# Patient Record
Sex: Female | Born: 1970 | Race: Black or African American | Hispanic: No | Marital: Married | Smoking: Never smoker
Health system: Southern US, Community
[De-identification: ages and names within clinical notes are randomized; demographics above are authoritative.]

---

## 2000-09-09 ENCOUNTER — Other Ambulatory Visit: Admission: RE | Admit: 2000-09-09 | Discharge: 2000-09-09 | Payer: Self-pay | Admitting: Obstetrics and Gynecology

## 2001-04-18 ENCOUNTER — Inpatient Hospital Stay (HOSPITAL_COMMUNITY): Admission: AD | Admit: 2001-04-18 | Discharge: 2001-04-21 | Payer: Self-pay | Admitting: Obstetrics & Gynecology

## 2006-12-19 ENCOUNTER — Other Ambulatory Visit: Admission: RE | Admit: 2006-12-19 | Discharge: 2006-12-19 | Payer: Self-pay | Admitting: Obstetrics and Gynecology

## 2008-08-27 ENCOUNTER — Emergency Department (HOSPITAL_COMMUNITY): Admission: EM | Admit: 2008-08-27 | Discharge: 2008-08-27 | Payer: Self-pay | Admitting: Emergency Medicine

## 2008-08-27 IMAGING — CR DG RIBS W/ CHEST 3+V*L*
3 series · 3 of 3 positions shown · non-contrast
Comparison: No priors

CLINICAL DATA: Left chest pain with inspiration

LEFT RIBS AND CHEST - 3+ VIEW

[view not recorded (1 of 3)]
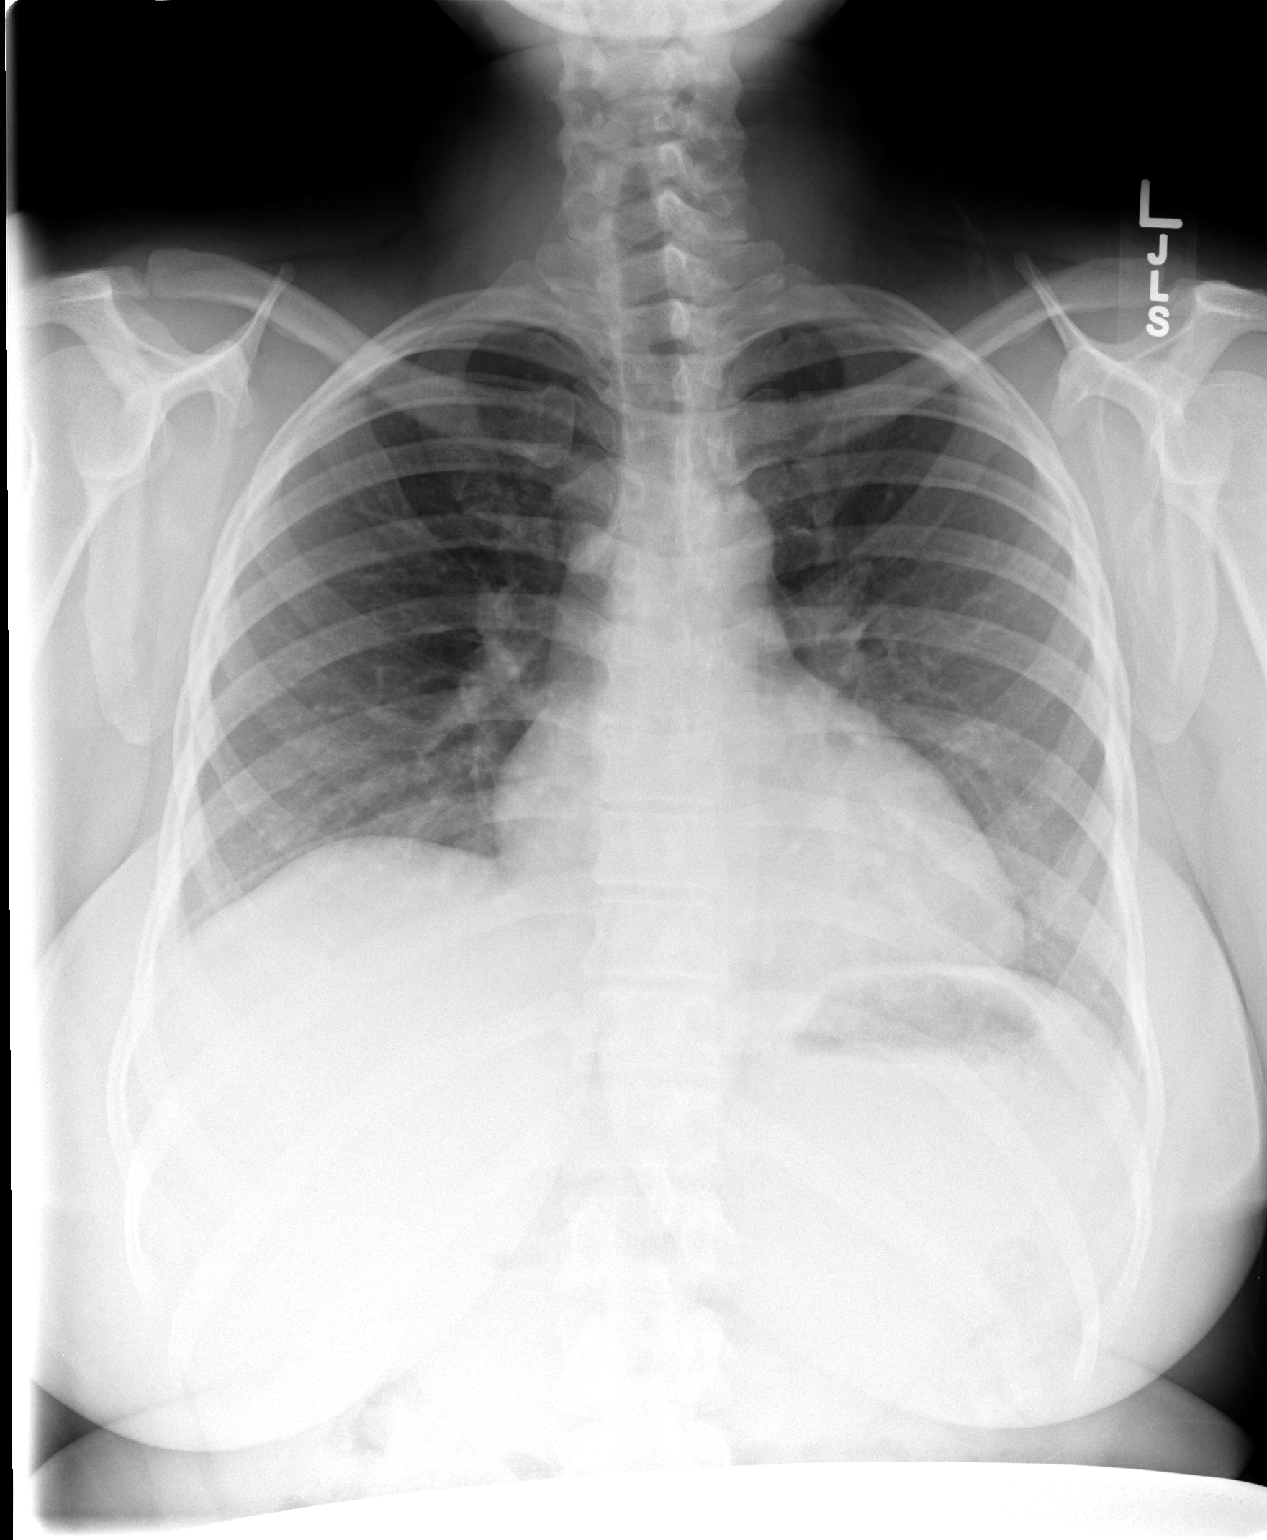

[view not recorded (2 of 3)]
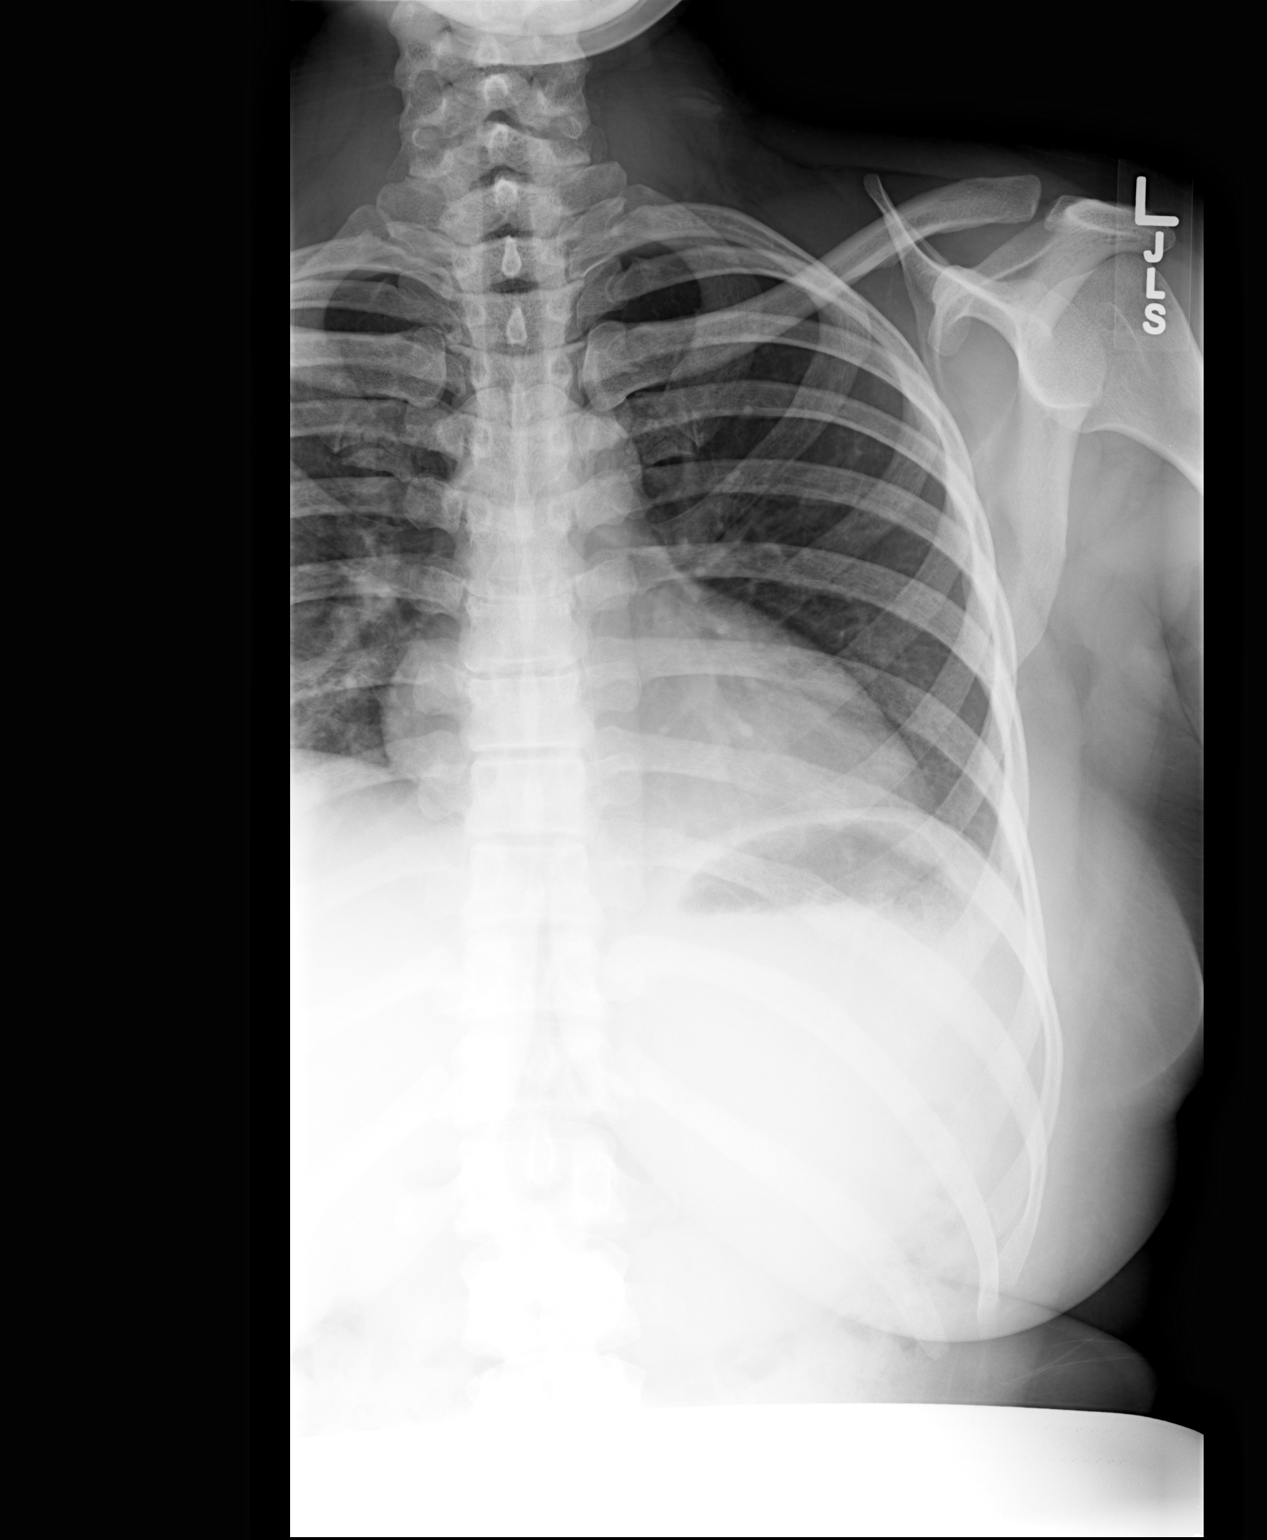

[view not recorded (3 of 3)]
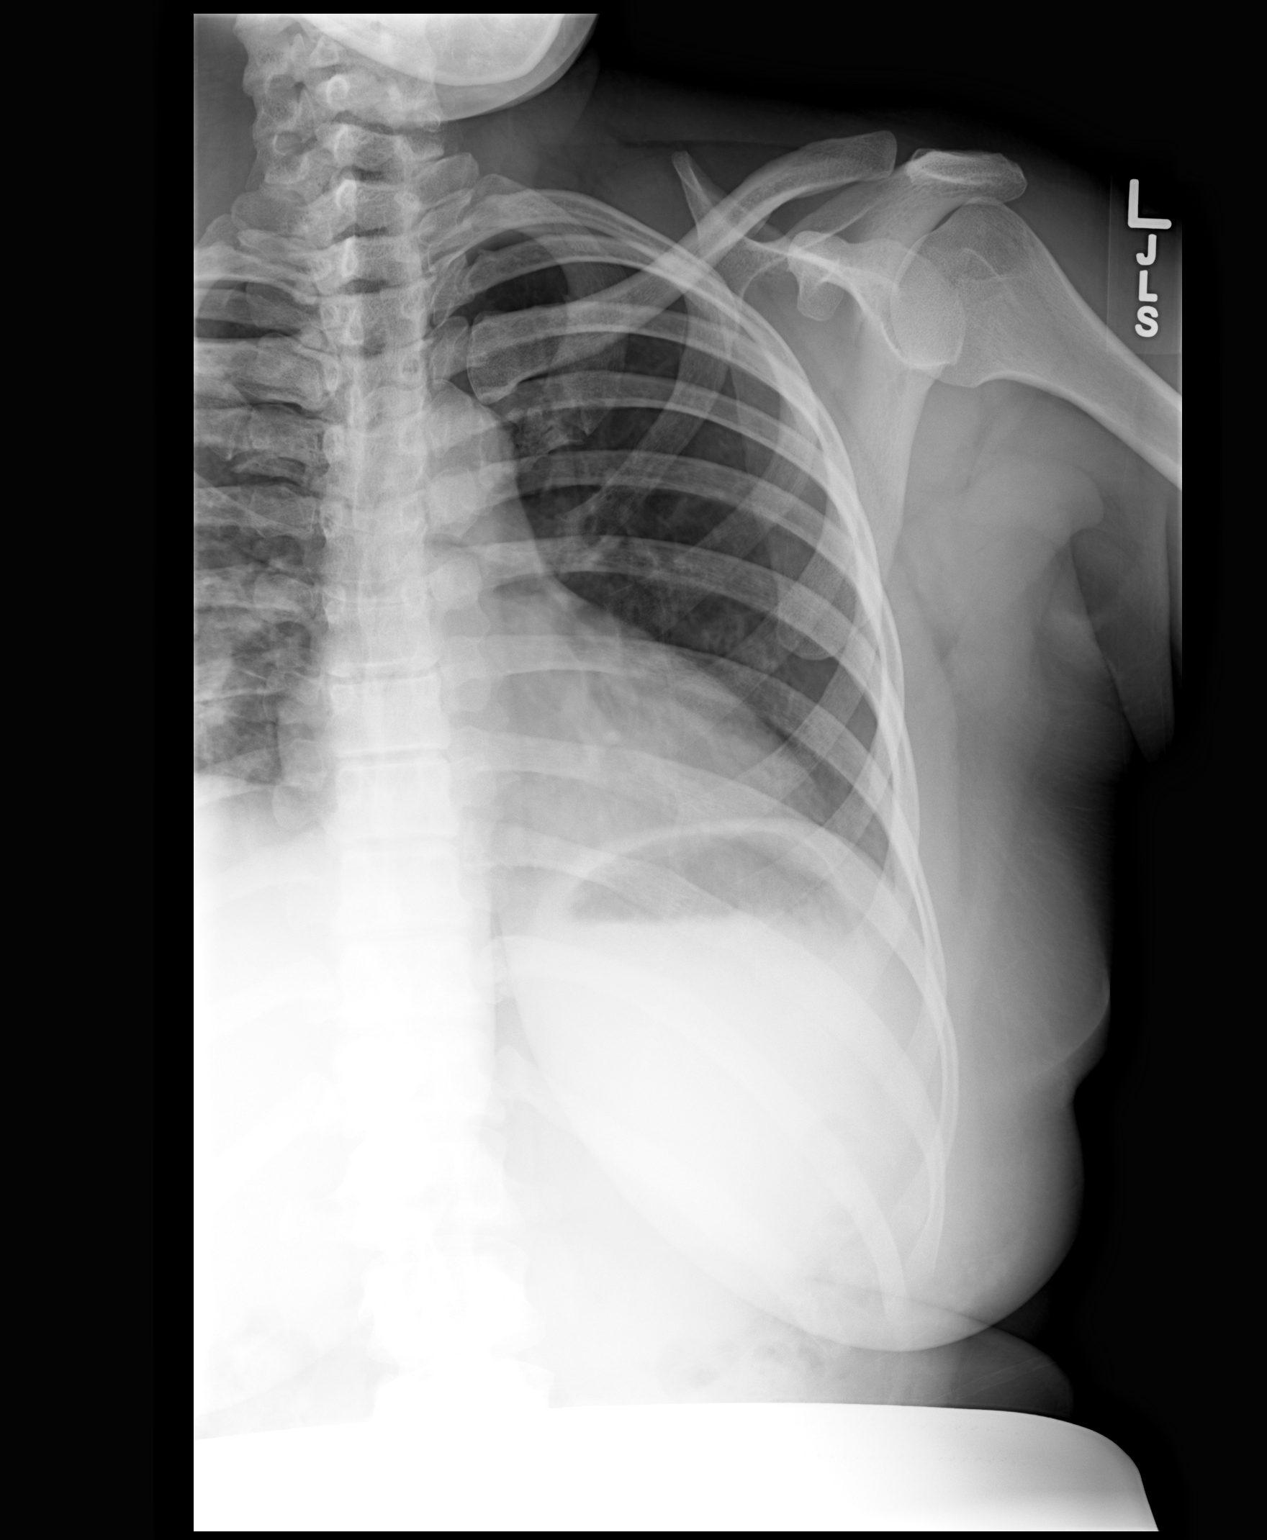

[3 of 3 positions shown; findings below may reference images not displayed]

FINDINGS: The abdomen and pelvis were shielded.  No rib fractures,
pneumothorax, or left pleural fluid.  Lungs clear.  Heart
mediastinal contours normal.
IMPRESSION: No acute or significant findings.

## 2010-08-21 ENCOUNTER — Emergency Department (HOSPITAL_COMMUNITY)
Admission: EM | Admit: 2010-08-21 | Discharge: 2010-08-21 | Payer: Self-pay | Source: Home / Self Care | Admitting: Family Medicine

## 2012-05-10 ENCOUNTER — Other Ambulatory Visit: Payer: Self-pay | Admitting: Obstetrics and Gynecology

## 2012-05-10 ENCOUNTER — Other Ambulatory Visit (HOSPITAL_COMMUNITY)
Admission: RE | Admit: 2012-05-10 | Discharge: 2012-05-10 | Disposition: A | Discharge status: Home / Self Care | Payer: 59 | Source: Ambulatory Visit | Attending: Obstetrics and Gynecology | Admitting: Obstetrics and Gynecology

## 2012-05-10 DIAGNOSIS — Z113 Encounter for screening for infections with a predominantly sexual mode of transmission: Secondary | ICD-10-CM | POA: Insufficient documentation

## 2012-05-10 DIAGNOSIS — Z1159 Encounter for screening for other viral diseases: Secondary | ICD-10-CM | POA: Insufficient documentation

## 2012-05-10 DIAGNOSIS — Z01419 Encounter for gynecological examination (general) (routine) without abnormal findings: Secondary | ICD-10-CM | POA: Insufficient documentation

## 2014-04-03 ENCOUNTER — Emergency Department (HOSPITAL_COMMUNITY)
Admission: EM | Admit: 2014-04-03 | Discharge: 2014-04-03 | Disposition: A | Discharge status: Home / Self Care | Payer: Self-pay | Source: Home / Self Care

## 2015-05-16 ENCOUNTER — Emergency Department (HOSPITAL_BASED_OUTPATIENT_CLINIC_OR_DEPARTMENT_OTHER)
Admission: EM | Admit: 2015-05-16 | Discharge: 2015-05-16 | Disposition: A | Discharge status: Home / Self Care | Payer: 59 | Attending: Emergency Medicine | Admitting: Emergency Medicine

## 2015-05-16 ENCOUNTER — Encounter (HOSPITAL_BASED_OUTPATIENT_CLINIC_OR_DEPARTMENT_OTHER): Payer: Self-pay

## 2015-05-16 ENCOUNTER — Emergency Department (HOSPITAL_BASED_OUTPATIENT_CLINIC_OR_DEPARTMENT_OTHER): Payer: 59

## 2015-05-16 DIAGNOSIS — M79645 Pain in left finger(s): Secondary | ICD-10-CM | POA: Diagnosis not present

## 2015-05-16 DIAGNOSIS — M25442 Effusion, left hand: Secondary | ICD-10-CM | POA: Insufficient documentation

## 2015-05-16 DIAGNOSIS — M79642 Pain in left hand: Secondary | ICD-10-CM | POA: Diagnosis present

## 2015-05-16 IMAGING — DX DG FINGER THUMB 2+V*L*
3 series · 3 of 3 positions shown · non-contrast
Comparison: None.

CLINICAL DATA: Left thumb pain, with pain and swelling at the
interphalangeal joint. Initial encounter.

EXAM:
LEFT THUMB 2+V

[finger ap]
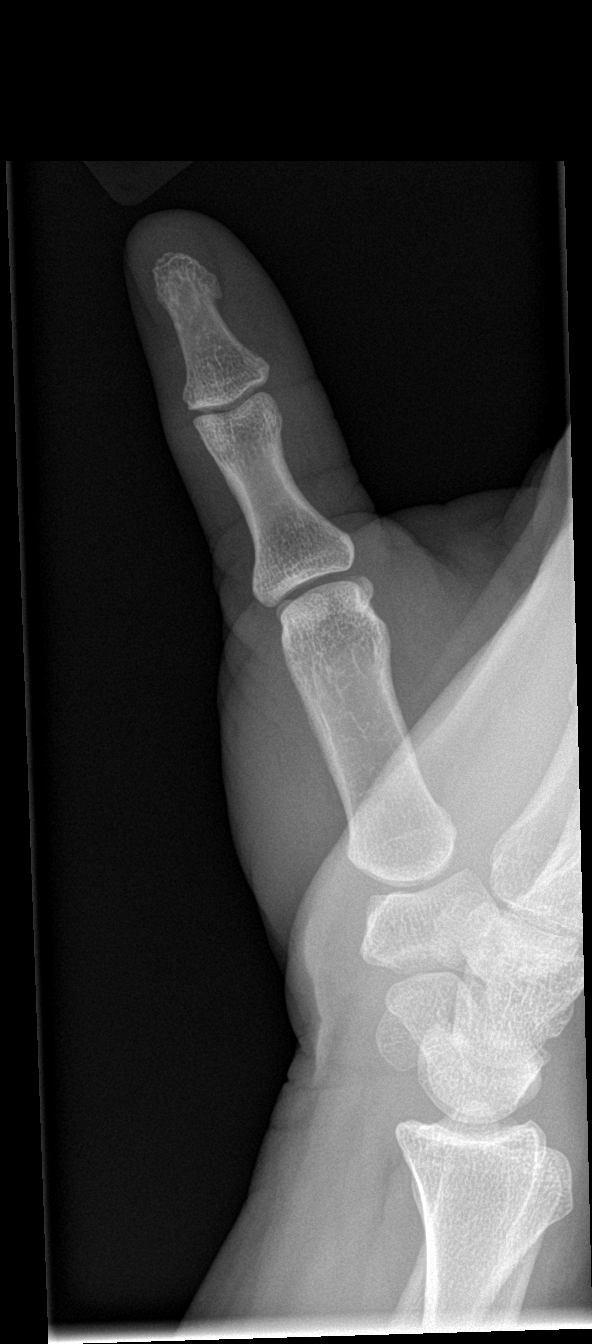

[finger obl]
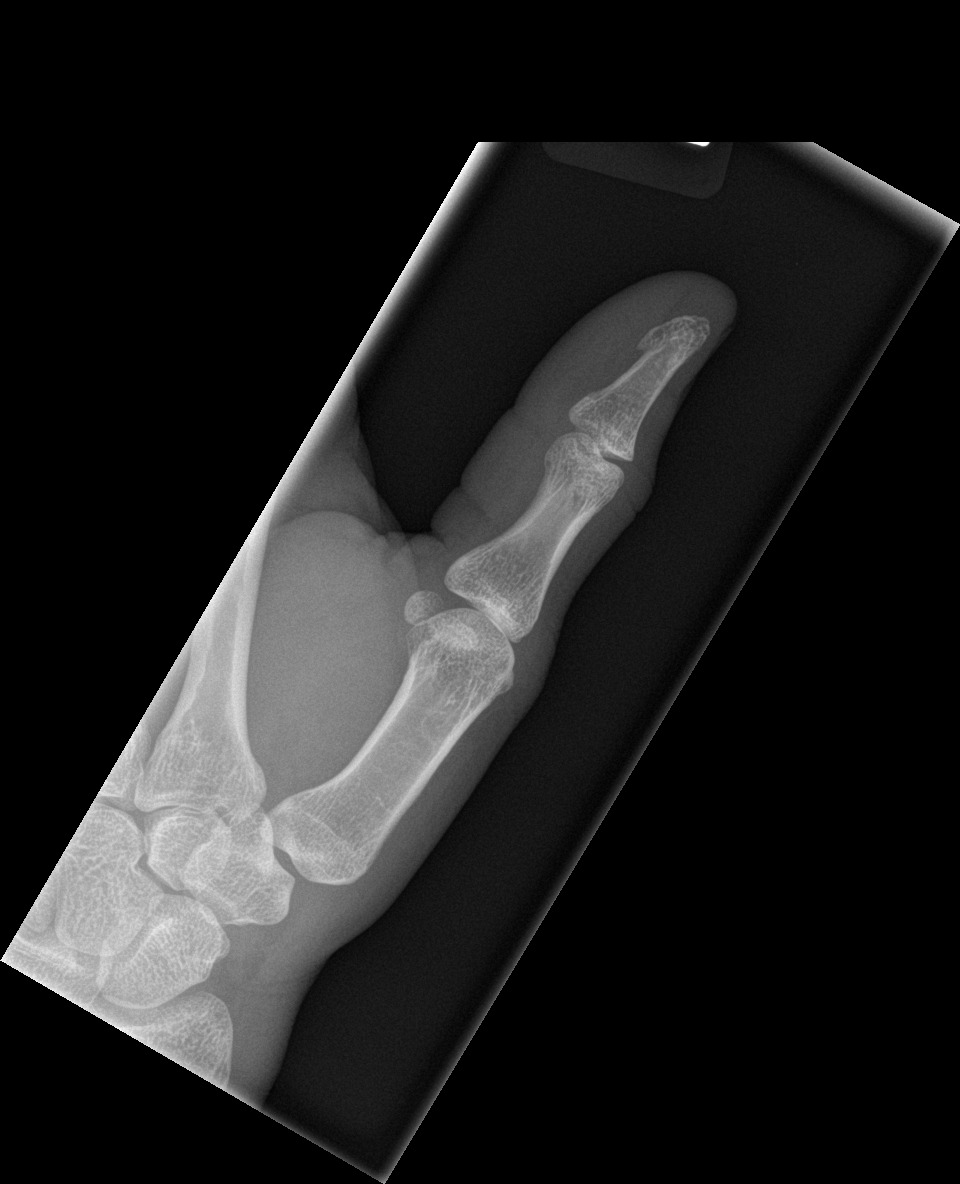

[finger lat]
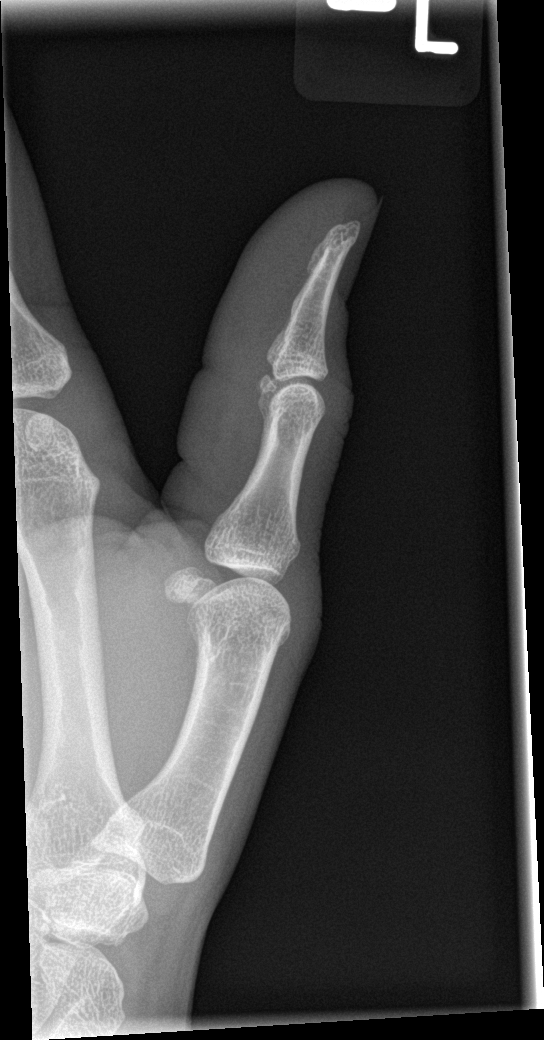

[3 of 3 positions shown; findings below may reference images not displayed]

FINDINGS: There is no evidence of fracture or dislocation. The left thumb
appears intact. Visualized joint spaces are preserved. No definite
soft tissue abnormalities are characterized on radiograph.
IMPRESSION: No evidence of fracture or dislocation.

## 2015-05-16 MED ORDER — HYDROCODONE-ACETAMINOPHEN 5-325 MG PO TABS
ORAL_TABLET | ORAL | Status: AC
  Filled 2015-05-16: qty 1

## 2015-05-16 MED ORDER — IBUPROFEN 800 MG PO TABS
800.0000 mg | ORAL_TABLET | Freq: Once | ORAL | Status: AC
  Administered 2015-05-16: 800 mg via ORAL
  Filled 2015-05-16: qty 1

## 2015-05-16 MED ORDER — IBUPROFEN 800 MG PO TABS: 800.0000 mg | ORAL_TABLET | Freq: Three times a day (TID) | ORAL | Status: AC

## 2015-05-16 MED ORDER — HYDROCODONE-ACETAMINOPHEN 5-325 MG PO TABS
2.0000 | ORAL_TABLET | Freq: Once | ORAL | Status: DC
Start: 2015-05-16 — End: 2015-05-16
  Administered 2015-05-16: 1 via ORAL

## 2015-05-16 NOTE — ED Notes (Signed)
Pt c/o lt thumb pain, no injury

## 2015-05-16 NOTE — ED Provider Notes (Signed)
CSN: [id number]     Arrival date & time 05/16/15  0426 History   First MD Initiated Contact with Patient 05/16/15 [phone]     Chief Complaint  Patient presents with  . Hand Pain     (Consider location/radiation/quality/duration/timing/severity/associated sxs/prior Treatment) The history is provided by the patient.  Stacy Morgan is a 44 y.o. female otherwise healthy here presenting with left thumb pain. Patient states that the joints in her hands are always swollen. For the last several days, she noticed that her thumb has been more painful and swollen. Denies pain around the nails. Denies fall or injury. Woke her up from sleep. No hx of lupus, rheumatoid arthritis, or gout.    History reviewed. No pertinent past medical history. History reviewed. No pertinent past surgical history. No family history on file. History  Substance Use Topics  . Smoking status: Never Smoker   . Smokeless tobacco: Not on file  . Alcohol Use: No   OB History    No data available     Review of Systems  Musculoskeletal:       L thumb pain   All other systems reviewed and are negative.     Allergies  Review of patient's allergies indicates no known allergies.  Home Medications   Prior to Admission medications   Not on File   BP 126/81 mmHg  Pulse 76  Temp(Src) 99.8 F (37.7 C) (Oral)  Resp 20  Ht 5\' 2"  (1.575 m)  Wt 176 lb (79.833 kg)  BMI 32.18 kg/m2  SpO2 98%  LMP 04/24/2015 Physical Exam  Constitutional: She is oriented to person, place, and time. She appears well-nourished.  Uncomfortable   HENT:  Head: Normocephalic.  Eyes: Pupils are equal, round, and reactive to light.  Neck: Normal range of motion.  Cardiovascular: Normal rate.   Pulmonary/Chest: Effort normal.  Abdominal: Soft.  Musculoskeletal:  L thumb interphalangeal joint tender and mildly swollen. Dec ROM from pain. No evidence of paronychia or felon. Nl capillary refill. PIP joints of the hands are swollen but no  painful.   Neurological: She is alert and oriented to person, place, and time.  Skin: Skin is warm and dry.  Psychiatric: She has a normal mood and affect. Her behavior is normal. Thought content normal.  Nursing note and vitals reviewed.   ED Course  Procedures (including critical care time) Labs Review Labs Reviewed - No data to display  Imaging Review Dg Finger Thumb Left  05/16/2015   CLINICAL DATA:  Left thumb pain, with pain and swelling at the interphalangeal joint. Initial encounter.  EXAM: LEFT THUMB 2+V  COMPARISON:  None.  FINDINGS: There is no evidence of fracture or dislocation. The left thumb appears intact. Visualized joint spaces are preserved. No definite soft tissue abnormalities are characterized on radiograph.  IMPRESSION: No evidence of fracture or dislocation.   Electronically Signed   By: Garald Balding M.D.   On: 05/16/2015 04:55     EKG Interpretation None      MDM   Final diagnoses:  None   Stacy Morgan is a 44 y.o. female here with L thumb pain. Consider rheumatoid arthritis vs gout. I doubt septic joint. Will get xrays and give pain meds.   5:03 AM Xray showed no evidence of arthritis or fracture. Will dc home with motrin, ice pack. Likely inflamed joint. Will give hand surgery f/u.    Wandra Arthurs, MD 05/16/15 [phone]

## 2015-05-16 NOTE — Discharge Instructions (Signed)
Take motrin for pain.   Apply ice on it.   Follow up with hand surgery.   Return to ER if you have severe pain, worse joint swelling.

## 2015-06-12 ENCOUNTER — Other Ambulatory Visit: Payer: Self-pay | Admitting: Family Medicine

## 2015-06-12 ENCOUNTER — Ambulatory Visit
Admission: RE | Admit: 2015-06-12 | Discharge: 2015-06-12 | Disposition: A | Discharge status: Home / Self Care | Payer: 59 | Source: Ambulatory Visit | Attending: Family Medicine | Admitting: Family Medicine

## 2015-06-12 DIAGNOSIS — M542 Cervicalgia: Secondary | ICD-10-CM

## 2015-06-12 IMAGING — CR DG CERVICAL SPINE 2 OR 3 VIEWS
4 series · 4 of 4 positions shown · non-contrast
Comparison: None.

CLINICAL DATA: Patient started having posterior neck pain 1 week
ago. Pain has worsened from the last 2 days. Also having dizziness
and nausea. No known injury.

EXAM:
CERVICAL SPINE - 2-3 VIEW

[w cervical spine lat]
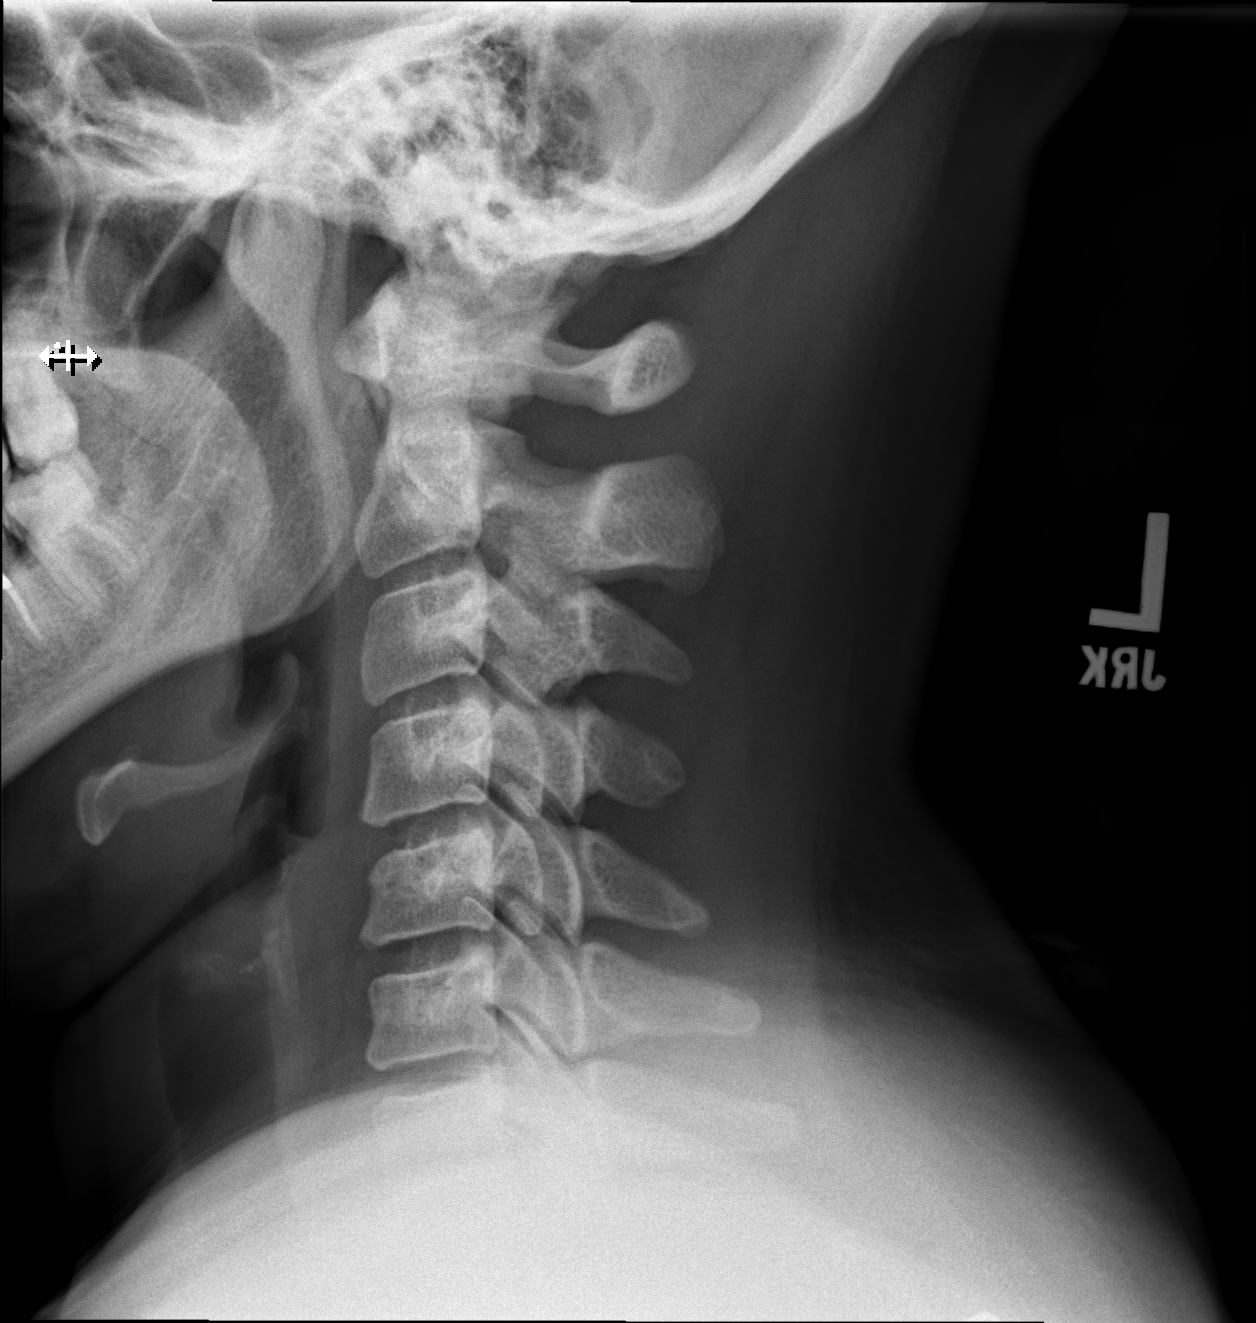

[w cervical swimmers]
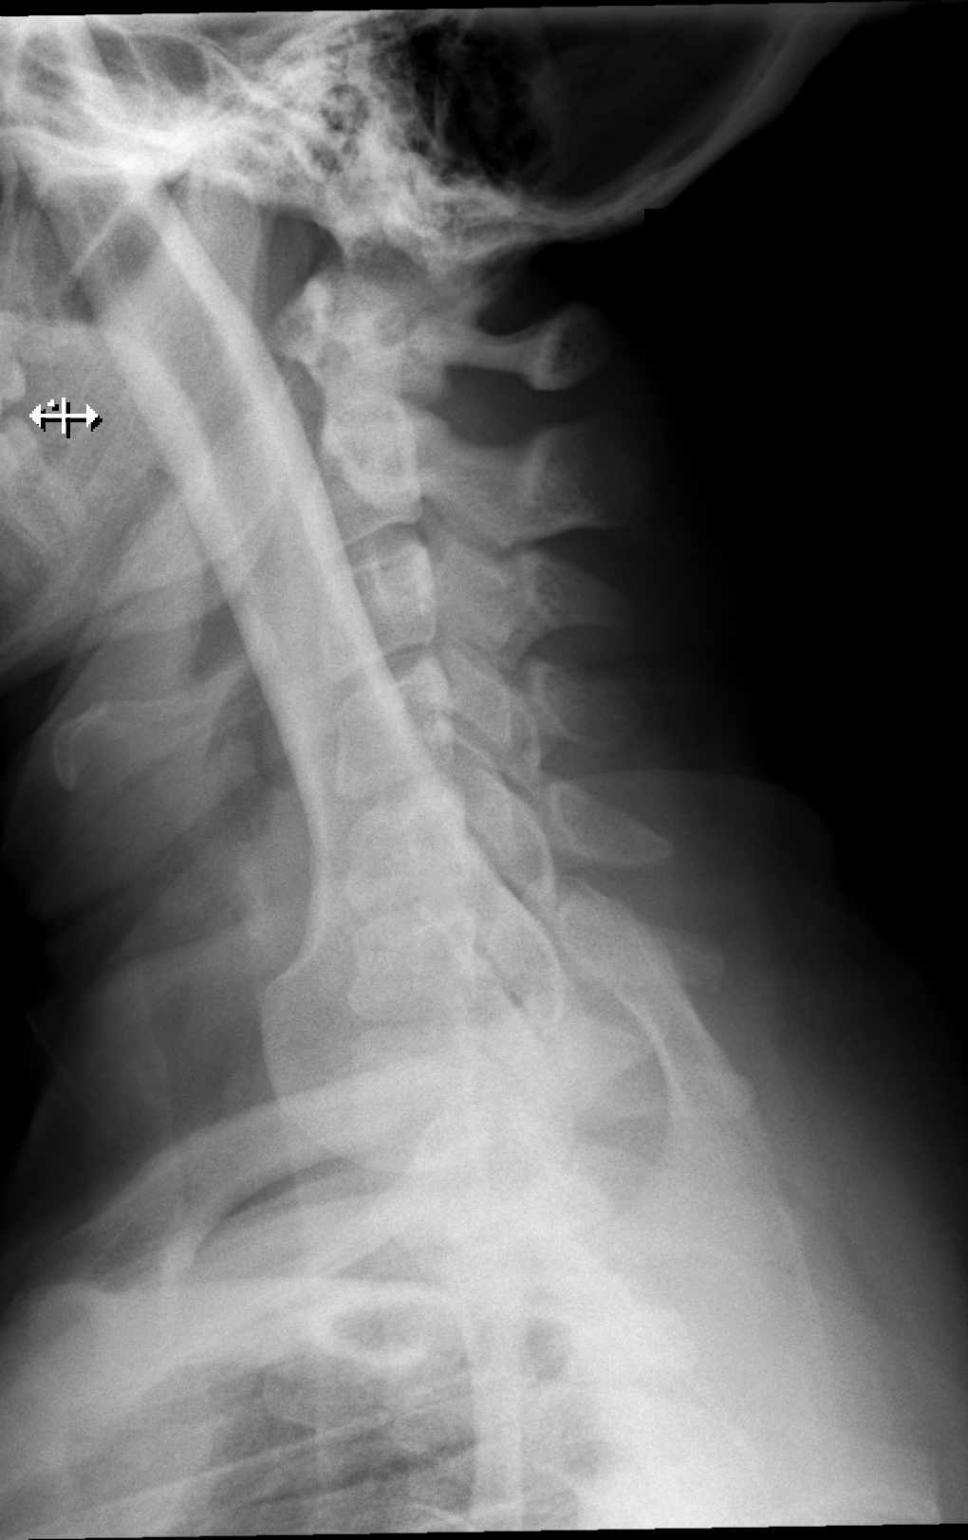

[w cervical spine odontoid (1 of 2)]
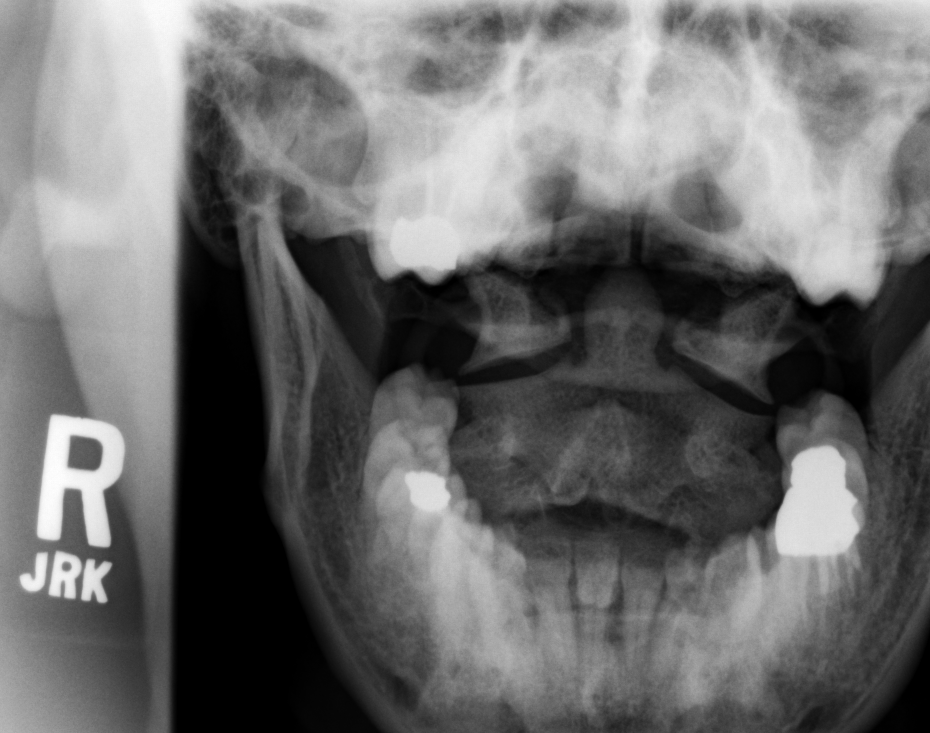

[w cervical spine odontoid (2 of 2)]
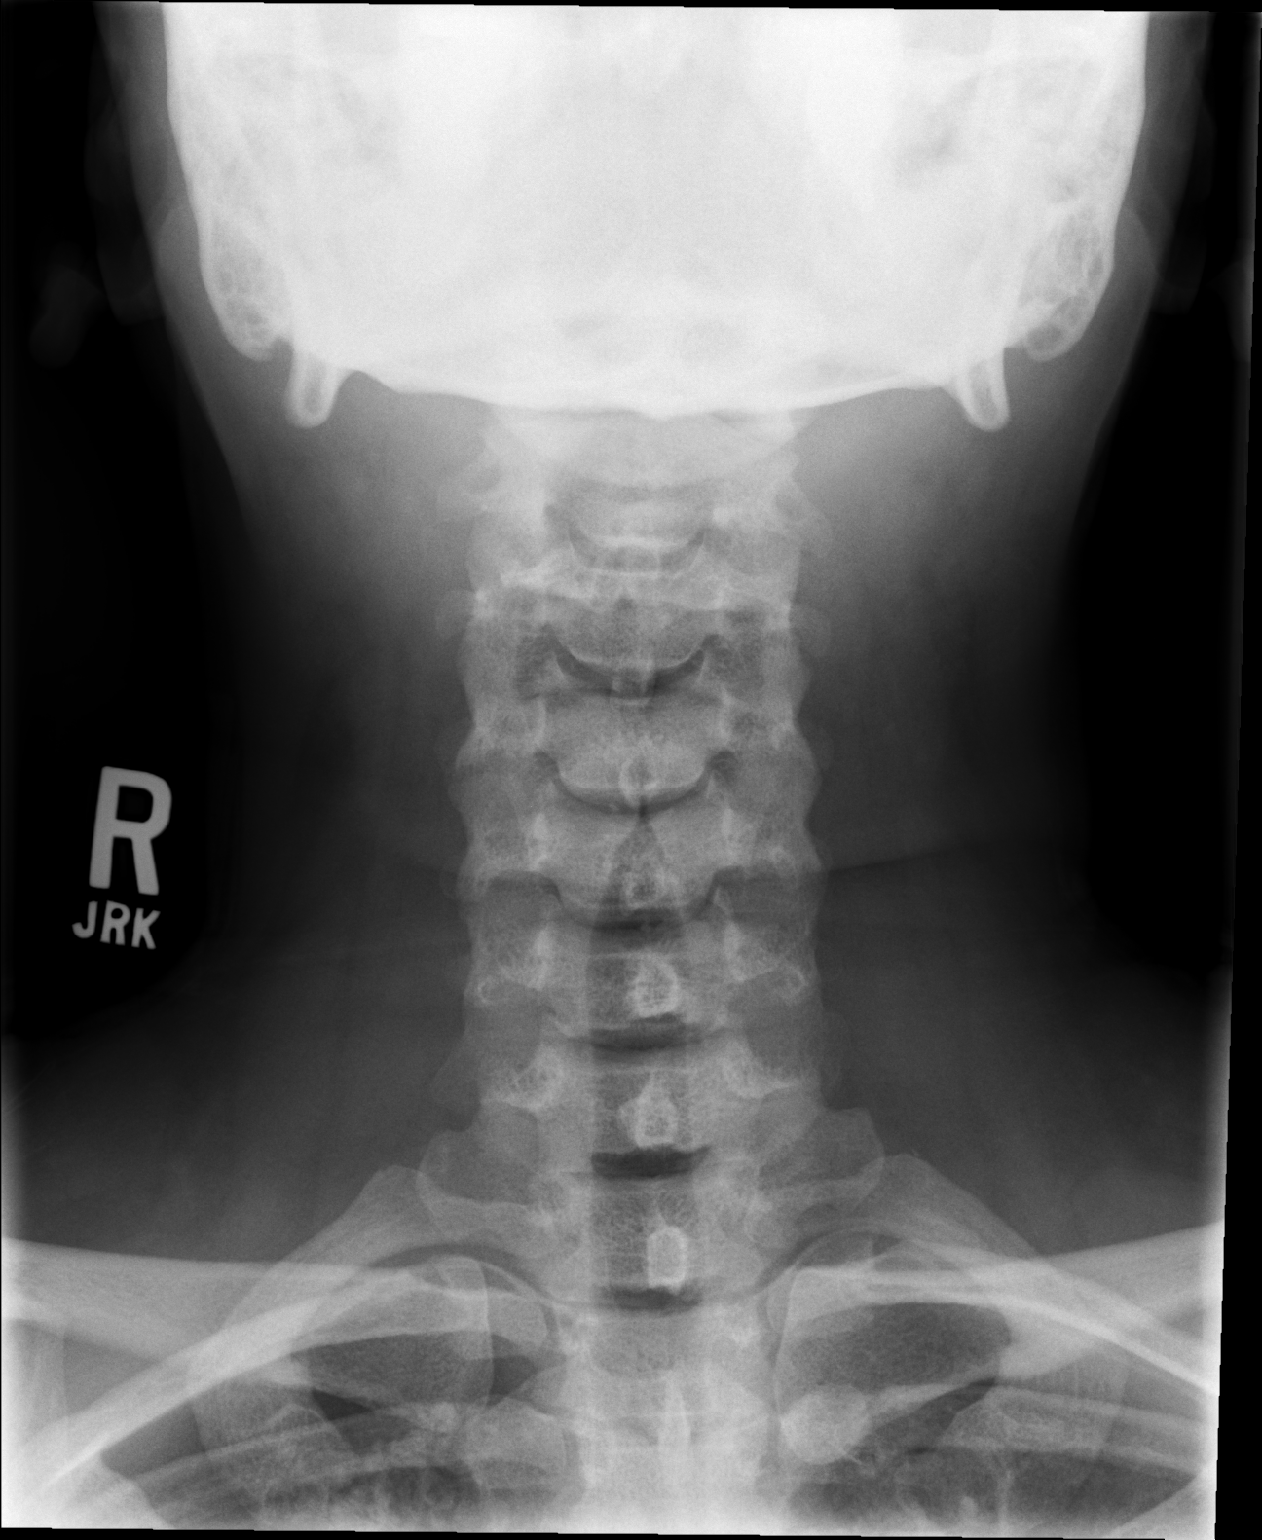

[4 of 4 positions shown; findings below may reference images not displayed]

FINDINGS: No fracture. No spondylolisthesis. Disc spaces are well maintained.
No degenerative change. Normal soft tissues.
IMPRESSION: Normal exam.

## 2016-08-10 DIAGNOSIS — Z3141 Encounter for fertility testing: Secondary | ICD-10-CM | POA: Diagnosis not present

## 2016-08-17 DIAGNOSIS — Z36 Encounter for antenatal screening of mother: Secondary | ICD-10-CM | POA: Diagnosis not present

## 2016-08-17 DIAGNOSIS — Z1159 Encounter for screening for other viral diseases: Secondary | ICD-10-CM | POA: Diagnosis not present

## 2016-08-17 DIAGNOSIS — Z118 Encounter for screening for other infectious and parasitic diseases: Secondary | ICD-10-CM | POA: Diagnosis not present

## 2016-08-17 DIAGNOSIS — E288 Other ovarian dysfunction: Secondary | ICD-10-CM | POA: Diagnosis not present

## 2016-08-17 DIAGNOSIS — Z113 Encounter for screening for infections with a predominantly sexual mode of transmission: Secondary | ICD-10-CM | POA: Diagnosis not present

## 2016-09-28 DIAGNOSIS — Z3202 Encounter for pregnancy test, result negative: Secondary | ICD-10-CM | POA: Diagnosis not present

## 2016-09-28 DIAGNOSIS — D259 Leiomyoma of uterus, unspecified: Secondary | ICD-10-CM | POA: Diagnosis not present

## 2016-09-28 DIAGNOSIS — E288 Other ovarian dysfunction: Secondary | ICD-10-CM | POA: Diagnosis not present

## 2016-09-28 MED FILL — ZOVIA 1-35E TABLET: 1-35 | 28 days supply | Qty: 28 | Fill #0

## 2016-10-27 DIAGNOSIS — N8300 Follicular cyst of ovary, unspecified side: Secondary | ICD-10-CM | POA: Diagnosis not present

## 2016-10-28 MED FILL — LETROZOLE 2.5 MG TABLET: 2.5 | 5 days supply | Qty: 10 | Fill #0

## 2016-10-28 MED FILL — CLOMIPHENE CITRATE 50 MG TA: 50 | 12 days supply | Qty: 24 | Fill #0

## 2016-10-29 MED FILL — GANIRELIX ACET 250 MCG/0.5: 250 | 28 days supply | Qty: 1 | Fill #0

## 2016-10-29 MED FILL — MENOPUR 75 UNIT VIAL: 75 | 28 days supply | Qty: 10 | Fill #0

## 2016-10-29 MED FILL — GONAL-F RFF REDI-JECT 900 U: 900 | 28 days supply | Qty: 3 | Fill #0

## 2016-10-29 MED FILL — BD SAFETYGLIDE NDL 25GX1: 25G X 1" | 15 days supply | Qty: 15 | Fill #0

## 2016-10-29 MED FILL — BD 3 ML SYRINGE 18GX1-1/2: 18G X 1-1/2 | 15 days supply | Qty: 15 | Fill #0

## 2016-10-29 MED FILL — LEUPROLIDE 2WK 1 MG/0.2 ML: 1 | 28 days supply | Qty: 1 | Fill #0

## 2016-10-29 MED FILL — BD SAFETYGLIDE NDL 25GX1": 25G X 1" | 15 days supply | Qty: 15 | Fill #0

## 2016-10-29 MED FILL — GONAL-F RFF REDI-JECT 300 U: 300 | 1 days supply | Qty: 1 | Fill #0

## 2016-10-29 MED FILL — BD 3 ML SYRINGE 18GX1-1/2": 18G X 1-1/2 | 15 days supply | Qty: 15 | Fill #0

## 2016-11-02 DIAGNOSIS — N8300 Follicular cyst of ovary, unspecified side: Secondary | ICD-10-CM | POA: Diagnosis not present

## 2016-11-04 DIAGNOSIS — N8300 Follicular cyst of ovary, unspecified side: Secondary | ICD-10-CM | POA: Diagnosis not present

## 2016-11-07 DIAGNOSIS — N8300 Follicular cyst of ovary, unspecified side: Secondary | ICD-10-CM | POA: Diagnosis not present

## 2016-11-28 ENCOUNTER — Encounter (HOSPITAL_BASED_OUTPATIENT_CLINIC_OR_DEPARTMENT_OTHER): Payer: Self-pay | Admitting: Emergency Medicine

## 2016-11-28 ENCOUNTER — Emergency Department (HOSPITAL_BASED_OUTPATIENT_CLINIC_OR_DEPARTMENT_OTHER)
Admission: EM | Admit: 2016-11-28 | Discharge: 2016-11-29 | Disposition: A | Discharge status: Home / Self Care | Payer: 59 | Attending: Emergency Medicine | Admitting: Emergency Medicine

## 2016-11-28 DIAGNOSIS — Z791 Long term (current) use of non-steroidal anti-inflammatories (NSAID): Secondary | ICD-10-CM | POA: Diagnosis not present

## 2016-11-28 DIAGNOSIS — M5431 Sciatica, right side: Secondary | ICD-10-CM | POA: Diagnosis not present

## 2016-11-28 DIAGNOSIS — M5441 Lumbago with sciatica, right side: Secondary | ICD-10-CM | POA: Insufficient documentation

## 2016-11-28 DIAGNOSIS — M545 Low back pain: Secondary | ICD-10-CM | POA: Diagnosis not present

## 2016-11-28 LAB — URINALYSIS, MICROSCOPIC (REFLEX)

## 2016-11-28 LAB — URINALYSIS, ROUTINE W REFLEX MICROSCOPIC
Bilirubin Urine: NEGATIVE
Glucose, UA: NEGATIVE mg/dL
Ketones, ur: NEGATIVE mg/dL
Leukocytes, UA: NEGATIVE
Nitrite: NEGATIVE
Protein, ur: NEGATIVE mg/dL
Specific Gravity, Urine: 1.023 (ref 1.005–1.030)
pH: 5 (ref 5.0–8.0)

## 2016-11-28 LAB — PREGNANCY, URINE: Preg Test, Ur: NEGATIVE

## 2016-11-28 NOTE — ED Triage Notes (Signed)
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 Pt also reports pelvic pain. Pt is going through IVF and was supposed to have eggs retrieved this week but the procedure was cancelled.

## 2016-11-29 DIAGNOSIS — M5441 Lumbago with sciatica, right side: Secondary | ICD-10-CM | POA: Diagnosis not present

## 2016-11-29 DIAGNOSIS — Z791 Long term (current) use of non-steroidal anti-inflammatories (NSAID): Secondary | ICD-10-CM | POA: Diagnosis not present

## 2016-11-29 DIAGNOSIS — M545 Low back pain: Secondary | ICD-10-CM | POA: Diagnosis not present

## 2016-11-29 MED ORDER — OXYCODONE-ACETAMINOPHEN 5-325 MG PO TABS: 1.0000 | ORAL_TABLET | Freq: Four times a day (QID) | ORAL | 0 refills | Status: AC | PRN

## 2016-11-29 MED ORDER — ONDANSETRON 8 MG PO TBDP
8.0000 mg | ORAL_TABLET | Freq: Once | ORAL | Status: AC
  Administered 2016-11-29: 8 mg via ORAL
  Filled 2016-11-29: qty 1

## 2016-11-29 MED ORDER — HYDROMORPHONE HCL 1 MG/ML IJ SOLN
2.0000 mg | Freq: Once | INTRAMUSCULAR | Status: AC
  Administered 2016-11-29: 2 mg via INTRAMUSCULAR
  Filled 2016-11-29: qty 2

## 2016-11-29 MED FILL — OXYCODONE W/APAP 5/325 TAB: 5-325 | 5 days supply | Qty: 20 | Fill #0

## 2016-11-29 NOTE — ED Provider Notes (Signed)
[facility] Provider Note: Georgena Spurling, MD, FACEP  CSN: [id number] MRN: [id number] ARRIVAL: 11/28/16 at 2041 ROOM: [facility]10/[facility]10  By signing my name below, I, Neta Mends, attest that this documentation has been prepared under the direction and in the presence of Shanon Rosser, MD . Electronically Signed: Neta Mends, ED Scribe. 11/29/2016. 12:12 AM.  CHIEF COMPLAINT  Back Pain   HISTORY OF PRESENT ILLNESS  Stacy Morgan is a 46 y.o. female reports severe, sharp, constant right lower back pain that began 11/27/16 in the AM. Pt states that the pain radiates down her right leg. Pain is worse with movement and she has difficulty getting comfortable when sitting down. Pt denies numbness, tingling. She is not aware of any trauma that may have triggered this. She is currently on her menses.  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  Consultation with the [facility] state controlled substances database reveals the patient has received no opioid prescriptions in the past year.   History reviewed. No pertinent past medical history.  History reviewed. No pertinent surgical history.  No family history on file.  Social History  Substance Use Topics  . Smoking status: Never Smoker  . Smokeless tobacco: Never Used  . Alcohol use No    Prior to Admission medications   Medication Sig Start Date End Date Taking? Authorizing Provider  ibuprofen (ADVIL,MOTRIN) 800 MG tablet Take 1 tablet (800 mg total) by mouth 3 (three) times daily. 05/16/15   Drenda Freeze, MD  oxyCODONE-acetaminophen (PERCOCET) 5-325 MG tablet Take 1 tablet by mouth every 6 (six) hours as needed (for pain). 11/29/16   Shanon Rosser, MD    Allergies Patient has no known allergies.   REVIEW OF SYSTEMS  Negative except as noted here or in the History of Present Illness.   PHYSICAL EXAMINATION  Initial Vital Signs Blood pressure 140/75, pulse 81, temperature 98.6 F (37 C), temperature  source Oral, resp. rate 16, height 5\' 1"  (1.549 m), weight 179 lb (81.2 kg), last menstrual period 11/26/2016, SpO2 99 %.  Examination General: Well-developed, well-nourished female in no acute distress; appearance consistent with age of record HENT: normocephalic; atraumatic Eyes: pupils equal, round and reactive to light; extraocular muscles intact Neck: supple Back: Right paralumbar tenderness; positive straight leg raise on the right at ~30 degrees.  Heart: regular rate and rhythm Lungs: clear to auscultation bilaterally Abdomen: soft; nondistended; nontender; bowel sounds present Extremities: No deformity; full range of motion except for right lower extremity; pulses normal Neurologic: Awake, alert and oriented; motor function intact in all extremities and symmetric but examination limited by right sided leg pain; Sensation intact in lower extremities and symmetric; no facial droop Skin: Warm and dry Psychiatric: Flat affect   RESULTS  Summary of this visit's results, reviewed by myself:   EKG Interpretation  Date/Time:    Ventricular Rate:    PR Interval:    QRS Duration:   QT Interval:    QTC Calculation:   R Axis:     Text Interpretation:        Laboratory Studies: Results for orders placed or performed during the hospital encounter of 11/28/16 (from the past 24 hour(s))  Pregnancy, urine     Status: None   Collection Time: 11/28/16 11:35 PM  Result Value Ref Range   Preg Test, Ur NEGATIVE NEGATIVE  Urinalysis, Routine w reflex microscopic     Status: Abnormal   Collection Time: 11/28/16 11:35 PM  Result Value Ref Range   Color, Urine YELLOW YELLOW   APPearance CLEAR CLEAR   Specific Gravity, Urine 1.023 1.005 - 1.030   pH 5.0 5.0 - 8.0   Glucose, UA NEGATIVE NEGATIVE mg/dL   Hgb urine dipstick MODERATE (A) NEGATIVE   Bilirubin Urine NEGATIVE NEGATIVE   Ketones, ur NEGATIVE NEGATIVE mg/dL   Protein, ur NEGATIVE NEGATIVE mg/dL   Nitrite NEGATIVE NEGATIVE    Leukocytes, UA NEGATIVE NEGATIVE  Urinalysis, Microscopic (reflex)     Status: Abnormal   Collection Time: 11/28/16 11:35 PM  Result Value Ref Range   RBC / HPF 0-5 0 - 5 RBC/hpf   WBC, UA 0-5 0 - 5 WBC/hpf   Bacteria, UA RARE (A) NONE SEEN   Squamous Epithelial / LPF 0-5 (A) NONE SEEN   Uric Acid Crys, UA PRESENT    Imaging Studies: No results found.  ED COURSE  Nursing notes and initial vitals signs, including pulse oximetry, reviewed.  Vitals:   11/28/16 2051 11/28/16 2052 11/28/16 2348  BP: 155/71  140/75  Pulse: 93  81  Resp: 16  16  Temp: 98.5 F (36.9 C)  98.6 F (37 C)  TempSrc:   Oral  SpO2: 100%  99%  Weight:  179 lb (81.2 kg)   Height:  5\' 1"  (1.549 m)     PROCEDURES    ED DIAGNOSES     ICD-9-CM ICD-10-CM   1. Sciatica of right side 724.3 M54.31    I personally performed the services described in this documentation, which was scribed in my presence. The recorded information has been reviewed and is accurate.     Shanon Rosser, MD 11/29/16 Laureen Abrahams

## 2016-12-10 MED FILL — ATOVAQUONE-PROGUANIL 250-10: 250-100 | 23 days supply | Qty: 23 | Fill #0

## 2016-12-10 MED FILL — AZITHROMYCIN 500 MG TABLET: 500 | 3 days supply | Qty: 3 | Fill #0

## 2017-01-19 DIAGNOSIS — N8300 Follicular cyst of ovary, unspecified side: Secondary | ICD-10-CM | POA: Diagnosis not present

## 2017-01-19 MED FILL — CLOMIPHENE CITRATE 50 MG TA: 50 | 12 days supply | Qty: 24 | Fill #0

## 2017-01-19 MED FILL — MENOPUR 75 UNIT VIAL: 75 | 3 days supply | Qty: 10 | Fill #0

## 2017-01-19 MED FILL — GANIRELIX ACET 250 MCG/0.5: 250 | 28 days supply | Qty: 2 | Fill #0

## 2017-01-19 MED FILL — LETROZOLE 2.5 MG TABLET: 2.5 | 5 days supply | Qty: 10 | Fill #0

## 2017-01-25 DIAGNOSIS — N8300 Follicular cyst of ovary, unspecified side: Secondary | ICD-10-CM | POA: Diagnosis not present

## 2017-01-27 DIAGNOSIS — N8300 Follicular cyst of ovary, unspecified side: Secondary | ICD-10-CM | POA: Diagnosis not present

## 2017-01-29 DIAGNOSIS — N8300 Follicular cyst of ovary, unspecified side: Secondary | ICD-10-CM | POA: Diagnosis not present

## 2017-01-31 DIAGNOSIS — N8300 Follicular cyst of ovary, unspecified side: Secondary | ICD-10-CM | POA: Diagnosis not present

## 2017-02-01 DIAGNOSIS — N979 Female infertility, unspecified: Secondary | ICD-10-CM | POA: Diagnosis not present

## 2017-02-06 DIAGNOSIS — N979 Female infertility, unspecified: Secondary | ICD-10-CM | POA: Diagnosis not present

## 2017-03-07 DIAGNOSIS — D259 Leiomyoma of uterus, unspecified: Secondary | ICD-10-CM | POA: Diagnosis not present

## 2017-03-22 DIAGNOSIS — N840 Polyp of corpus uteri: Secondary | ICD-10-CM | POA: Diagnosis not present

## 2017-03-22 DIAGNOSIS — Z3202 Encounter for pregnancy test, result negative: Secondary | ICD-10-CM | POA: Diagnosis not present

## 2017-03-23 DIAGNOSIS — N710 Acute inflammatory disease of uterus: Secondary | ICD-10-CM | POA: Diagnosis not present

## 2017-04-12 DIAGNOSIS — E876 Hypokalemia: Secondary | ICD-10-CM | POA: Diagnosis not present

## 2017-04-12 DIAGNOSIS — D259 Leiomyoma of uterus, unspecified: Secondary | ICD-10-CM | POA: Diagnosis not present

## 2017-05-17 DIAGNOSIS — D259 Leiomyoma of uterus, unspecified: Secondary | ICD-10-CM | POA: Diagnosis not present

## 2017-05-19 DIAGNOSIS — D259 Leiomyoma of uterus, unspecified: Secondary | ICD-10-CM | POA: Diagnosis not present

## 2017-05-20 MED FILL — ESTRADIOL 2 MG TABLET: 2 | 30 days supply | Qty: 30 | Fill #0

## 2017-06-09 DIAGNOSIS — D259 Leiomyoma of uterus, unspecified: Secondary | ICD-10-CM | POA: Diagnosis not present

## 2017-07-06 DIAGNOSIS — S39012A Strain of muscle, fascia and tendon of lower back, initial encounter: Secondary | ICD-10-CM | POA: Diagnosis not present

## 2017-07-26 DIAGNOSIS — Z3202 Encounter for pregnancy test, result negative: Secondary | ICD-10-CM | POA: Diagnosis not present

## 2017-07-26 DIAGNOSIS — N920 Excessive and frequent menstruation with regular cycle: Secondary | ICD-10-CM | POA: Diagnosis not present

## 2017-07-26 DIAGNOSIS — N710 Acute inflammatory disease of uterus: Secondary | ICD-10-CM | POA: Diagnosis not present

## 2017-07-26 DIAGNOSIS — D259 Leiomyoma of uterus, unspecified: Secondary | ICD-10-CM | POA: Diagnosis not present

## 2017-11-04 MED FILL — KELNOR 1-35 28 TABLET: 1-35 | 28 days supply | Qty: 28 | Fill #0

## 2017-11-28 MED FILL — KELNOR 1-35 28 TABLET: 1-35 | 84 days supply | Qty: 84 | Fill #0

## 2017-12-15 MED FILL — ESTRING 2 MG VAGINAL RING: 2 | 90 days supply | Qty: 1 | Fill #0

## 2017-12-15 MED FILL — ESTRADIOL VALERATE 20 MG/ML: 20 | 30 days supply | Qty: 5 | Fill #0

## 2017-12-22 MED FILL — VITAFOL GUMMIES: 3.33-0.333- | 90 days supply | Qty: 90 | Fill #0

## 2017-12-26 MED FILL — METHYLPREDNISOLONE 16 MG TA: 16 | 6 days supply | Qty: 6 | Fill #0

## 2017-12-26 MED FILL — PROGESTERONE 200 MG CAPSULE: 200 | 1 days supply | Qty: 1 | Fill #0

## 2017-12-26 MED FILL — DOXYCYCLINE HYCLATE 100 MG: 100 | 6 days supply | Qty: 12 | Fill #0

## 2017-12-28 MED FILL — diazePAM 5 MG TABS: 5 | 1 days supply | Qty: 1 | Fill #0

## 2018-01-09 MED FILL — BD 3 ML SYRINGE 18GX1-1/2": 18G X 1-1/2 | 15 days supply | Qty: 25 | Fill #0

## 2018-01-09 MED FILL — PROGESTERONE OIL 50 MG/ML V: 50 | 30 days supply | Qty: 60 | Fill #0

## 2018-01-09 MED FILL — BD NEEDLES 22GX1": 22G X 1" | 25 days supply | Qty: 25 | Fill #0

## 2018-01-09 MED FILL — BD NEEDLES 22GX1: 22G X 1" | 25 days supply | Qty: 25 | Fill #0

## 2018-01-09 MED FILL — BD 3 ML SYRINGE 18GX1-1/2: 18G X 1-1/2 | 15 days supply | Qty: 25 | Fill #0

## 2018-01-16 DIAGNOSIS — Z3200 Encounter for pregnancy test, result unknown: Secondary | ICD-10-CM | POA: Diagnosis not present

## 2018-03-15 DIAGNOSIS — N912 Amenorrhea, unspecified: Secondary | ICD-10-CM | POA: Diagnosis not present

## 2018-03-30 DIAGNOSIS — M13171 Monoarthritis, not elsewhere classified, right ankle and foot: Secondary | ICD-10-CM | POA: Diagnosis not present

## 2018-03-30 DIAGNOSIS — M79674 Pain in right toe(s): Secondary | ICD-10-CM | POA: Diagnosis not present

## 2018-04-26 ENCOUNTER — Other Ambulatory Visit (HOSPITAL_COMMUNITY)
Admission: RE | Admit: 2018-04-26 | Discharge: 2018-04-26 | Disposition: A | Discharge status: Home / Self Care | Payer: 59 | Source: Ambulatory Visit | Attending: Obstetrics and Gynecology | Admitting: Obstetrics and Gynecology

## 2018-04-26 ENCOUNTER — Other Ambulatory Visit: Payer: Self-pay | Admitting: Obstetrics and Gynecology

## 2018-04-26 DIAGNOSIS — Z01411 Encounter for gynecological examination (general) (routine) with abnormal findings: Secondary | ICD-10-CM | POA: Insufficient documentation

## 2018-04-26 DIAGNOSIS — Z01419 Encounter for gynecological examination (general) (routine) without abnormal findings: Secondary | ICD-10-CM | POA: Diagnosis not present

## 2018-04-26 DIAGNOSIS — N914 Secondary oligomenorrhea: Secondary | ICD-10-CM | POA: Diagnosis not present

## 2018-04-27 ENCOUNTER — Other Ambulatory Visit: Payer: Self-pay | Admitting: Obstetrics and Gynecology

## 2018-04-27 DIAGNOSIS — Z1231 Encounter for screening mammogram for malignant neoplasm of breast: Secondary | ICD-10-CM

## 2018-04-27 LAB — CYTOLOGY - PAP
DIAGNOSIS: NEGATIVE
HPV (WINDOPATH): NOT DETECTED

## 2018-05-19 ENCOUNTER — Ambulatory Visit
Admission: RE | Admit: 2018-05-19 | Discharge: 2018-05-19 | Disposition: A | Discharge status: Home / Self Care | Payer: 59 | Source: Ambulatory Visit | Attending: Obstetrics and Gynecology | Admitting: Obstetrics and Gynecology

## 2018-05-19 DIAGNOSIS — Z1231 Encounter for screening mammogram for malignant neoplasm of breast: Secondary | ICD-10-CM

## 2018-06-07 DIAGNOSIS — M25541 Pain in joints of right hand: Secondary | ICD-10-CM | POA: Diagnosis not present

## 2018-06-07 DIAGNOSIS — Z23 Encounter for immunization: Secondary | ICD-10-CM | POA: Diagnosis not present

## 2018-06-07 DIAGNOSIS — Z0000 Encounter for general adult medical examination without abnormal findings: Secondary | ICD-10-CM | POA: Diagnosis not present

## 2018-06-07 DIAGNOSIS — Z136 Encounter for screening for cardiovascular disorders: Secondary | ICD-10-CM | POA: Diagnosis not present

## 2018-11-06 DIAGNOSIS — Z118 Encounter for screening for other infectious and parasitic diseases: Secondary | ICD-10-CM | POA: Diagnosis not present

## 2018-11-06 DIAGNOSIS — Z113 Encounter for screening for infections with a predominantly sexual mode of transmission: Secondary | ICD-10-CM | POA: Diagnosis not present

## 2018-11-06 DIAGNOSIS — E288 Other ovarian dysfunction: Secondary | ICD-10-CM | POA: Diagnosis not present

## 2018-11-06 DIAGNOSIS — Z369 Encounter for antenatal screening, unspecified: Secondary | ICD-10-CM | POA: Diagnosis not present

## 2018-11-06 DIAGNOSIS — N710 Acute inflammatory disease of uterus: Secondary | ICD-10-CM | POA: Diagnosis not present

## 2018-11-06 DIAGNOSIS — Z3202 Encounter for pregnancy test, result negative: Secondary | ICD-10-CM | POA: Diagnosis not present

## 2018-11-06 DIAGNOSIS — Z1159 Encounter for screening for other viral diseases: Secondary | ICD-10-CM | POA: Diagnosis not present

## 2018-11-06 DIAGNOSIS — D259 Leiomyoma of uterus, unspecified: Secondary | ICD-10-CM | POA: Diagnosis not present

## 2018-11-21 DIAGNOSIS — Z3183 Encounter for assisted reproductive fertility procedure cycle: Secondary | ICD-10-CM | POA: Diagnosis not present

## 2018-11-21 DIAGNOSIS — N978 Female infertility of other origin: Secondary | ICD-10-CM | POA: Diagnosis not present

## 2018-11-27 DIAGNOSIS — Z3183 Encounter for assisted reproductive fertility procedure cycle: Secondary | ICD-10-CM | POA: Diagnosis not present

## 2018-11-27 DIAGNOSIS — N978 Female infertility of other origin: Secondary | ICD-10-CM | POA: Diagnosis not present

## 2018-12-05 DIAGNOSIS — N978 Female infertility of other origin: Secondary | ICD-10-CM | POA: Diagnosis not present

## 2018-12-05 DIAGNOSIS — Z3183 Encounter for assisted reproductive fertility procedure cycle: Secondary | ICD-10-CM | POA: Diagnosis not present

## 2018-12-06 MED FILL — diazePAM 5 MG TABS: 5 | 1 days supply | Qty: 1 | Fill #0

## 2018-12-11 DIAGNOSIS — N978 Female infertility of other origin: Secondary | ICD-10-CM | POA: Diagnosis not present

## 2018-12-18 DIAGNOSIS — Z3200 Encounter for pregnancy test, result unknown: Secondary | ICD-10-CM | POA: Diagnosis not present

## 2018-12-18 MED FILL — DEXAMETHASONE 1 MG TABLET: 1 | 30 days supply | Qty: 30 | Fill #1

## 2018-12-25 DIAGNOSIS — Z3200 Encounter for pregnancy test, result unknown: Secondary | ICD-10-CM | POA: Diagnosis not present

## 2019-07-10 DIAGNOSIS — Z01419 Encounter for gynecological examination (general) (routine) without abnormal findings: Secondary | ICD-10-CM | POA: Diagnosis not present

## 2019-07-16 ENCOUNTER — Other Ambulatory Visit: Payer: Self-pay | Admitting: Obstetrics and Gynecology

## 2019-07-16 DIAGNOSIS — Z1231 Encounter for screening mammogram for malignant neoplasm of breast: Secondary | ICD-10-CM

## 2019-07-18 ENCOUNTER — Other Ambulatory Visit: Payer: Self-pay

## 2019-07-18 ENCOUNTER — Ambulatory Visit
Admission: RE | Admit: 2019-07-18 | Discharge: 2019-07-18 | Disposition: A | Discharge status: Home / Self Care | Payer: 59 | Source: Ambulatory Visit | Attending: Obstetrics and Gynecology | Admitting: Obstetrics and Gynecology

## 2019-07-18 DIAGNOSIS — Z1231 Encounter for screening mammogram for malignant neoplasm of breast: Secondary | ICD-10-CM

## 2019-07-18 IMAGING — MG DIGITAL SCREENING BILATERAL MAMMOGRAM WITH TOMO AND CAD
6 of 10 series · 6 of 30 positions shown · non-contrast
Comparison: Previous exam(s).

CLINICAL DATA: Screening.

EXAM:
DIGITAL SCREENING BILATERAL MAMMOGRAM WITH TOMO AND CAD

[L MLO synth-2D]
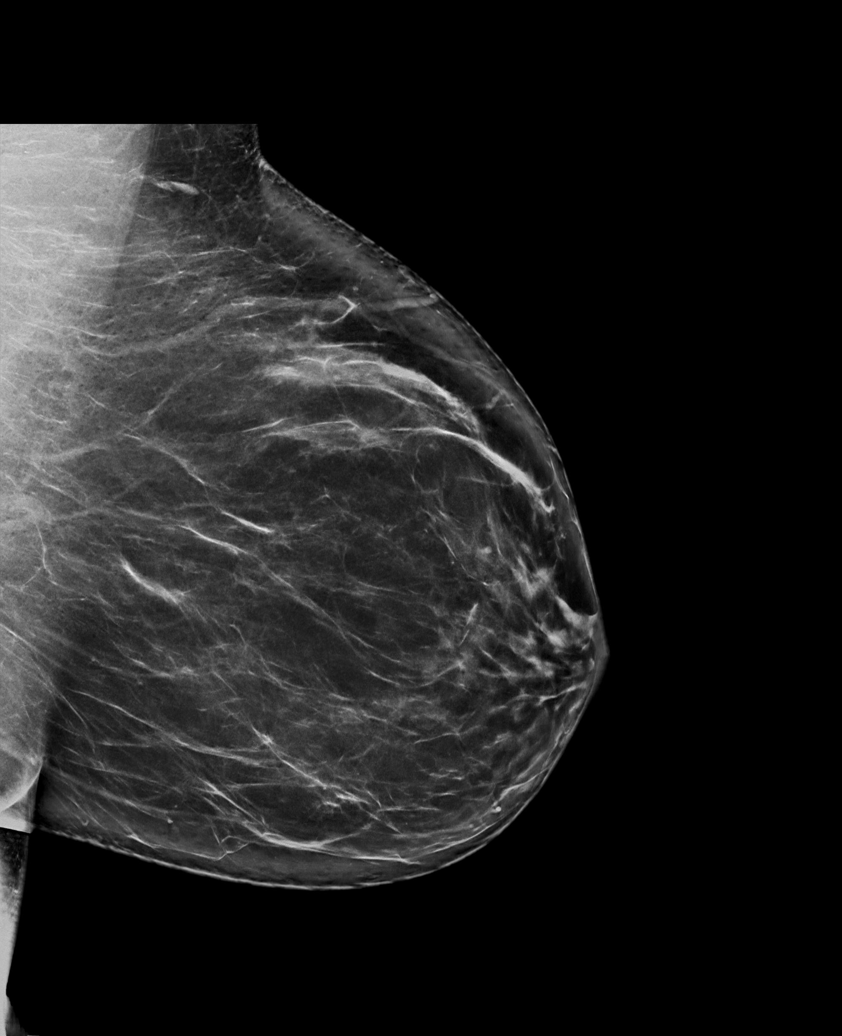

[R CC synth-2D]
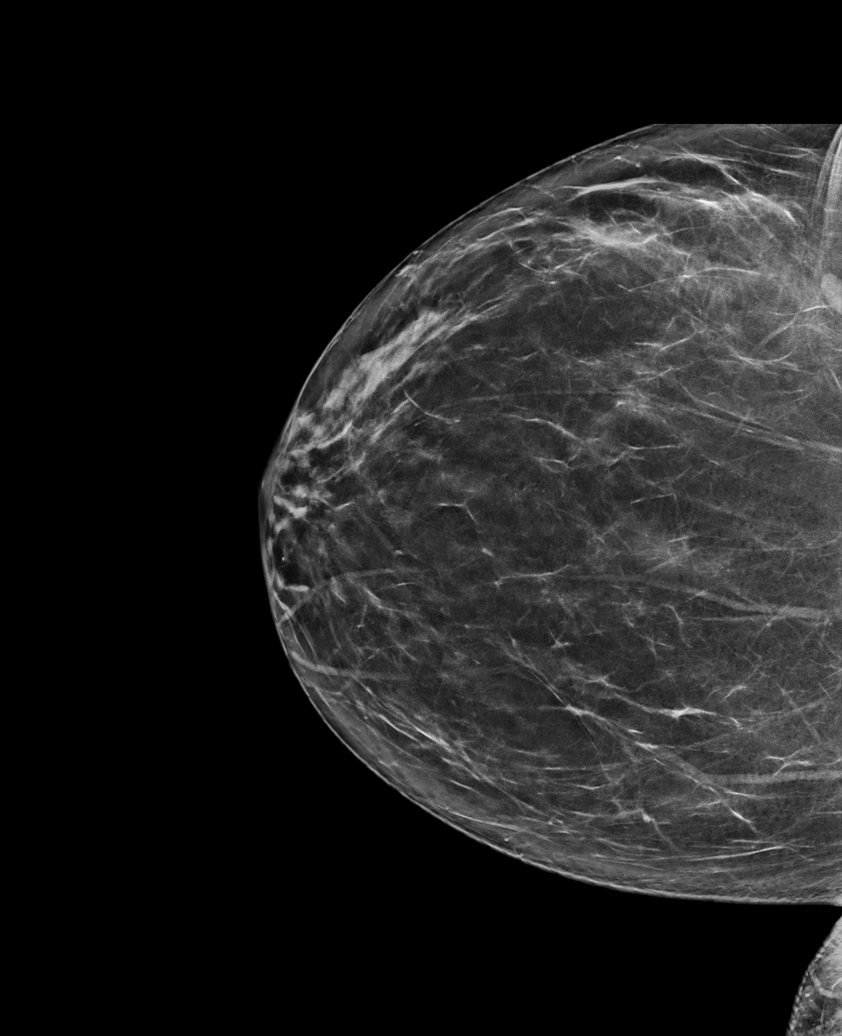

[R MLO synth-2D]
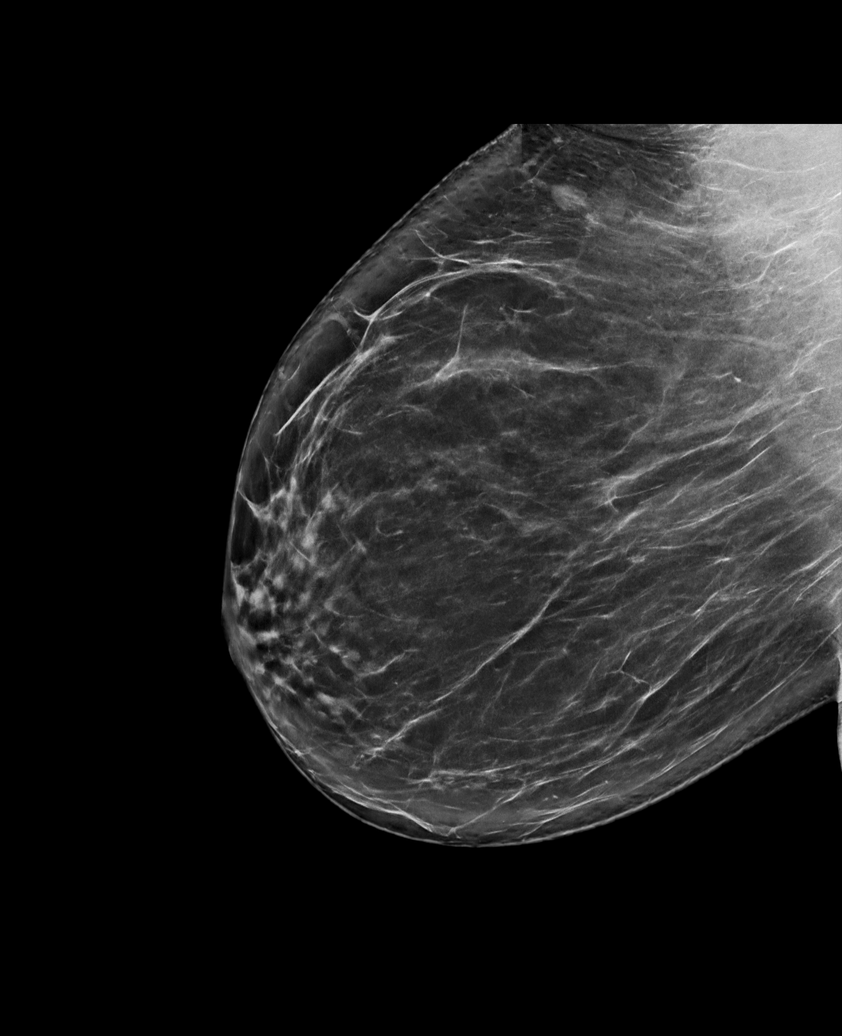

[L CC synth-2D]
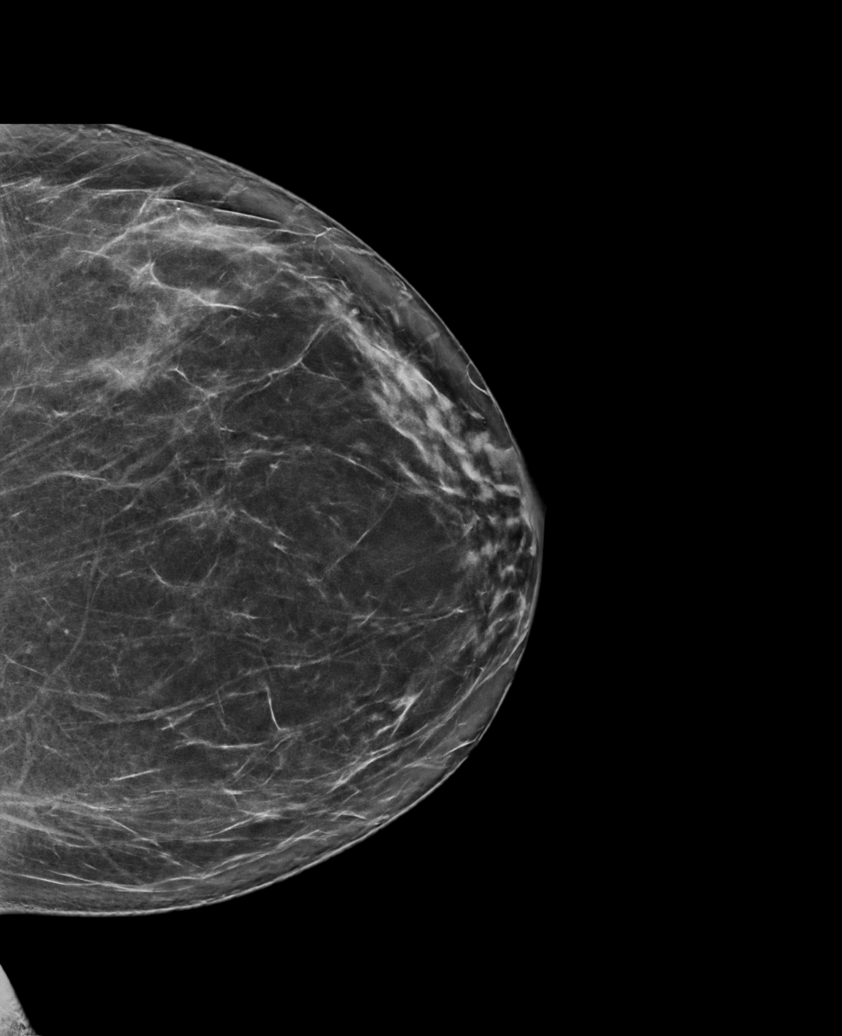

[R XCCL synth-2D]
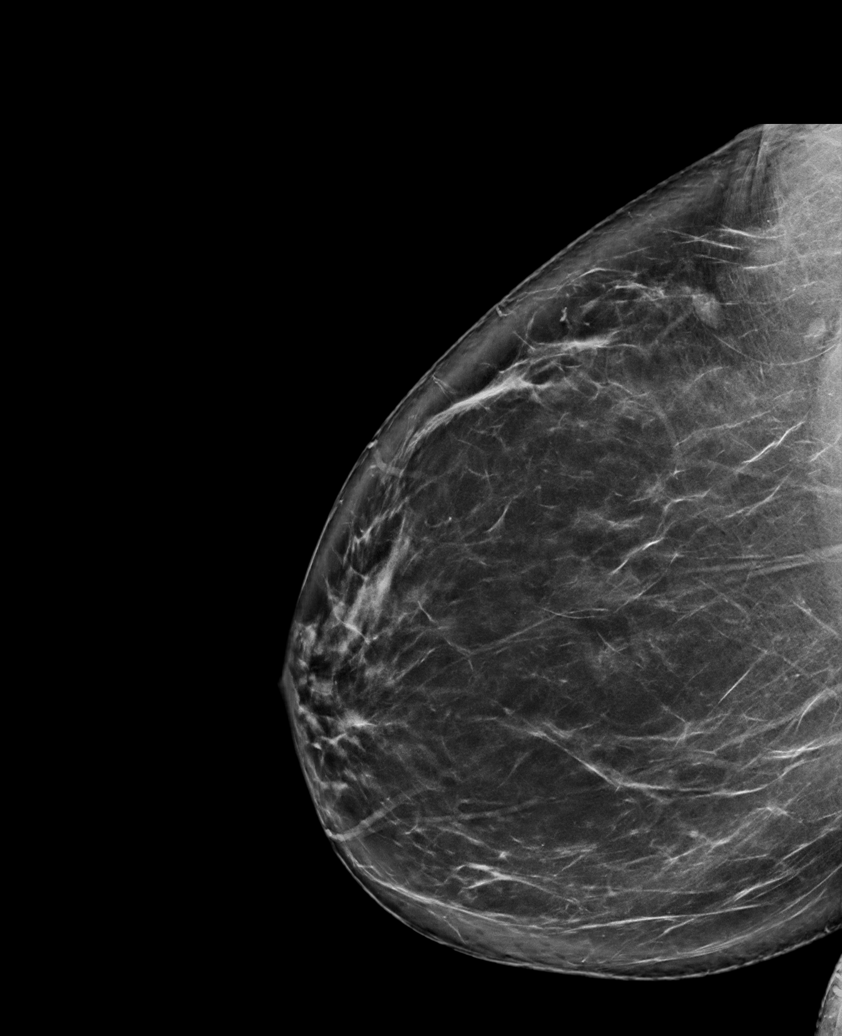

[L CC tomo · tomo slice 39/78.0]
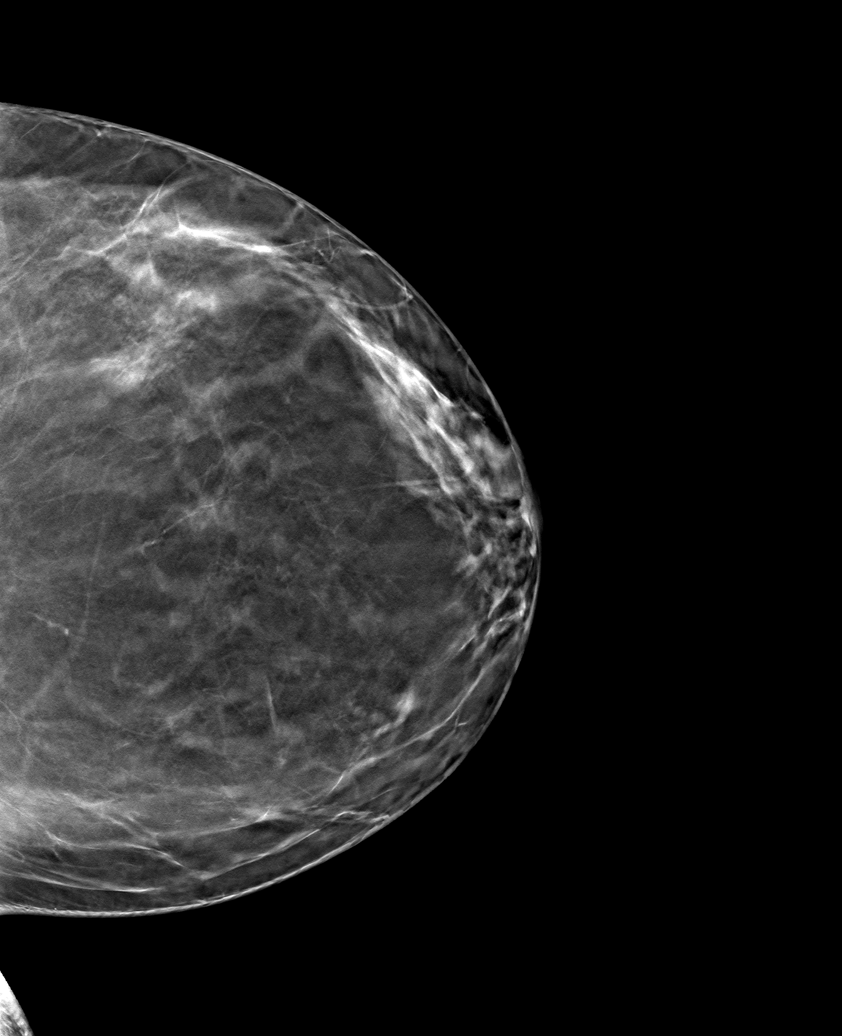

[6 of 30 positions shown; findings below may reference images not displayed]

ACR Breast Density Category b: There are scattered areas of
fibroglandular density.
FINDINGS: There are no findings suspicious for malignancy. Images were
processed with CAD.
IMPRESSION: No mammographic evidence of malignancy. A result letter of this
screening mammogram will be mailed directly to the patient.

RECOMMENDATION:
Screening mammogram in one year. (Code:[TQ])

BI-RADS CATEGORY  1: Negative.

## 2019-09-20 DIAGNOSIS — E559 Vitamin D deficiency, unspecified: Secondary | ICD-10-CM | POA: Diagnosis not present

## 2019-09-20 DIAGNOSIS — R7301 Impaired fasting glucose: Secondary | ICD-10-CM | POA: Diagnosis not present

## 2019-09-20 DIAGNOSIS — Z13220 Encounter for screening for lipoid disorders: Secondary | ICD-10-CM | POA: Diagnosis not present

## 2019-09-20 DIAGNOSIS — M2550 Pain in unspecified joint: Secondary | ICD-10-CM | POA: Diagnosis not present

## 2019-09-20 DIAGNOSIS — Z0000 Encounter for general adult medical examination without abnormal findings: Secondary | ICD-10-CM | POA: Diagnosis not present

## 2019-09-20 DIAGNOSIS — E669 Obesity, unspecified: Secondary | ICD-10-CM | POA: Diagnosis not present

## 2020-03-21 ENCOUNTER — Emergency Department (HOSPITAL_BASED_OUTPATIENT_CLINIC_OR_DEPARTMENT_OTHER)
Admission: EM | Admit: 2020-03-21 | Discharge: 2020-03-22 | Disposition: A | Discharge status: Home / Self Care | Payer: No Typology Code available for payment source | Attending: Emergency Medicine | Admitting: Emergency Medicine

## 2020-03-21 ENCOUNTER — Encounter (HOSPITAL_BASED_OUTPATIENT_CLINIC_OR_DEPARTMENT_OTHER): Payer: Self-pay | Admitting: *Deleted

## 2020-03-21 ENCOUNTER — Other Ambulatory Visit: Payer: Self-pay

## 2020-03-21 ENCOUNTER — Emergency Department (HOSPITAL_BASED_OUTPATIENT_CLINIC_OR_DEPARTMENT_OTHER): Payer: No Typology Code available for payment source

## 2020-03-21 DIAGNOSIS — S199XXA Unspecified injury of neck, initial encounter: Secondary | ICD-10-CM | POA: Diagnosis not present

## 2020-03-21 DIAGNOSIS — M7918 Myalgia, other site: Secondary | ICD-10-CM | POA: Diagnosis not present

## 2020-03-21 DIAGNOSIS — R519 Headache, unspecified: Secondary | ICD-10-CM | POA: Diagnosis not present

## 2020-03-21 DIAGNOSIS — Z79899 Other long term (current) drug therapy: Secondary | ICD-10-CM | POA: Diagnosis not present

## 2020-03-21 DIAGNOSIS — M25562 Pain in left knee: Secondary | ICD-10-CM | POA: Diagnosis not present

## 2020-03-21 DIAGNOSIS — S0990XA Unspecified injury of head, initial encounter: Secondary | ICD-10-CM | POA: Diagnosis not present

## 2020-03-21 DIAGNOSIS — R930 Abnormal findings on diagnostic imaging of skull and head, not elsewhere classified: Secondary | ICD-10-CM | POA: Diagnosis not present

## 2020-03-21 DIAGNOSIS — R531 Weakness: Secondary | ICD-10-CM | POA: Diagnosis not present

## 2020-03-21 DIAGNOSIS — R200 Anesthesia of skin: Secondary | ICD-10-CM | POA: Diagnosis not present

## 2020-03-21 DIAGNOSIS — G44311 Acute post-traumatic headache, intractable: Secondary | ICD-10-CM | POA: Diagnosis not present

## 2020-03-21 DIAGNOSIS — R29898 Other symptoms and signs involving the musculoskeletal system: Secondary | ICD-10-CM | POA: Diagnosis not present

## 2020-03-21 DIAGNOSIS — R42 Dizziness and giddiness: Secondary | ICD-10-CM | POA: Diagnosis not present

## 2020-03-21 DIAGNOSIS — S3992XA Unspecified injury of lower back, initial encounter: Secondary | ICD-10-CM | POA: Diagnosis not present

## 2020-03-21 DIAGNOSIS — M4802 Spinal stenosis, cervical region: Secondary | ICD-10-CM | POA: Diagnosis not present

## 2020-03-21 DIAGNOSIS — S299XXA Unspecified injury of thorax, initial encounter: Secondary | ICD-10-CM | POA: Diagnosis not present

## 2020-03-21 DIAGNOSIS — G959 Disease of spinal cord, unspecified: Secondary | ICD-10-CM | POA: Insufficient documentation

## 2020-03-21 LAB — CBC WITH DIFFERENTIAL/PLATELET
Abs Immature Granulocytes: 0.02 10*3/uL (ref 0.00–0.07)
Basophils Absolute: 0.1 10*3/uL (ref 0.0–0.1)
Basophils Relative: 1 %
Eosinophils Absolute: 0.1 10*3/uL (ref 0.0–0.5)
Eosinophils Relative: 1 %
HCT: 40.8 % (ref 36.0–46.0)
Hemoglobin: 13.8 g/dL (ref 12.0–15.0)
Immature Granulocytes: 0 %
Lymphocytes Relative: 38 %
Lymphs Abs: 3.4 10*3/uL (ref 0.7–4.0)
MCH: 28.9 pg (ref 26.0–34.0)
MCHC: 33.8 g/dL (ref 30.0–36.0)
MCV: 85.5 fL (ref 80.0–100.0)
Monocytes Absolute: 0.6 10*3/uL (ref 0.1–1.0)
Monocytes Relative: 6 %
Neutro Abs: 4.9 10*3/uL (ref 1.7–7.7)
Neutrophils Relative %: 54 %
Platelets: 313 10*3/uL (ref 150–400)
RBC: 4.77 MIL/uL (ref 3.87–5.11)
RDW: 13.6 % (ref 11.5–15.5)
WBC: 9.1 10*3/uL (ref 4.0–10.5)
nRBC: 0 % (ref 0.0–0.2)

## 2020-03-21 LAB — BASIC METABOLIC PANEL
Anion gap: 7 (ref 5–15)
BUN: 14 mg/dL (ref 6–20)
CO2: 25 mmol/L (ref 22–32)
Calcium: 9 mg/dL (ref 8.9–10.3)
Chloride: 107 mmol/L (ref 98–111)
Creatinine, Ser: 0.84 mg/dL (ref 0.44–1.00)
GFR calc Af Amer: >60 mL/min (ref >60)
GFR calc non Af Amer: >60 mL/min (ref >60)
Glucose, Bld: 117 mg/dL — ABNORMAL HIGH (ref 70–99)
Potassium: 3.3 mmol/L — ABNORMAL LOW (ref 3.5–5.1)
Sodium: 139 mmol/L (ref 135–145)

## 2020-03-21 LAB — PREGNANCY, URINE: Preg Test, Ur: NEGATIVE

## 2020-03-21 IMAGING — CT CT CERVICAL SPINE W/O CM
4 series · 15 of 33 positions shown, 18 images · IV contrast (Omnipaque)
Comparison: None.

CLINICAL DATA: Status post trauma.

EXAM:
CT CERVICAL SPINE WITHOUT CONTRAST
TECHNIQUE: Multidetector CT imaging of the cervical spine was performed without
intravenous contrast. Multiplanar CT image reconstructions were also
generated.

[Series 22: c spine st · axial · 0.29mm/px · z∈[+842,+938]mm · 5 of 74 slices shown, 7 images]
[im 13/74  soft-tissue]
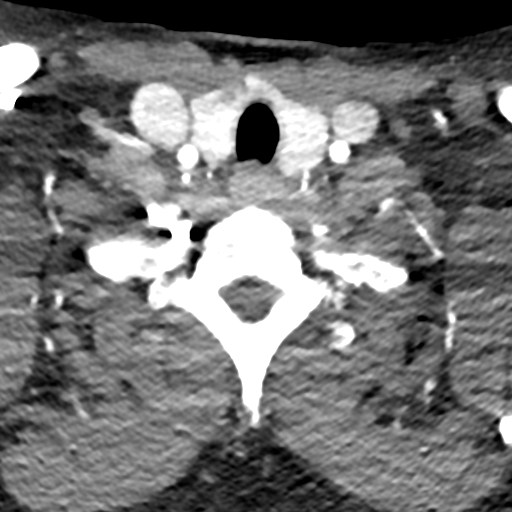
[im 13/74  bone]
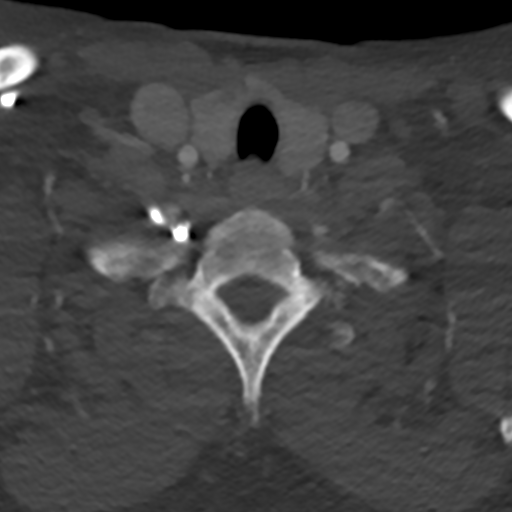
[im 25/74  bone]
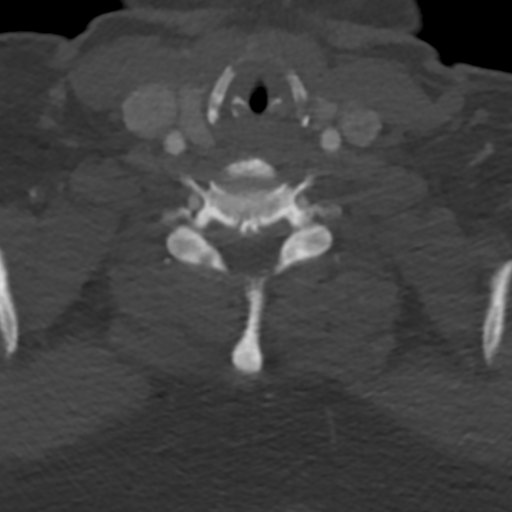
[im 37/74  bone]
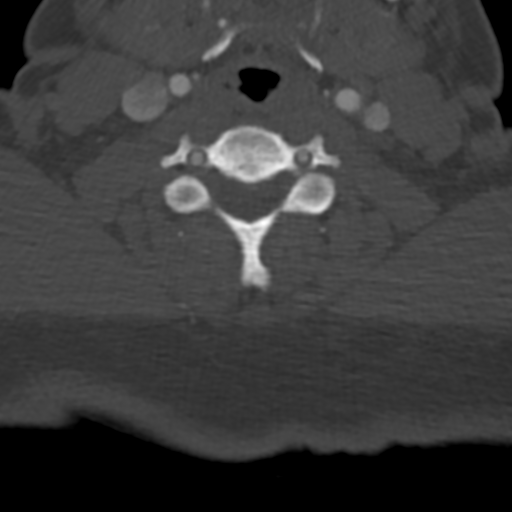
[im 49/74  bone]
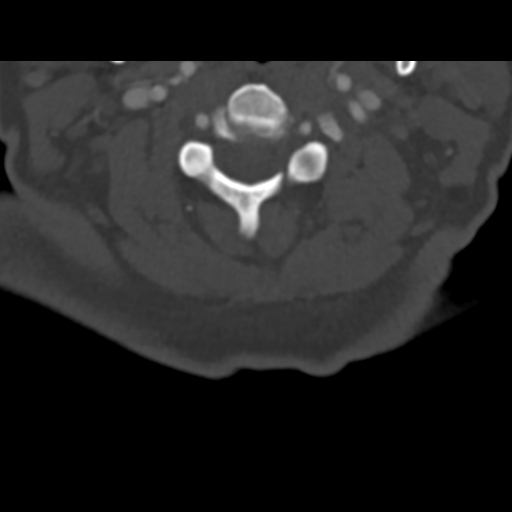
[im 61/74  soft-tissue]
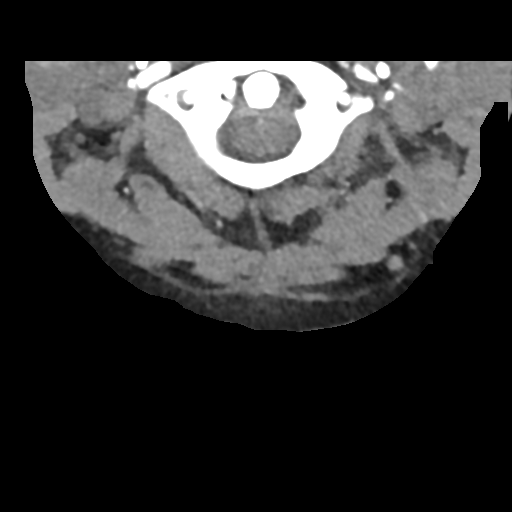
[im 61/74  bone]
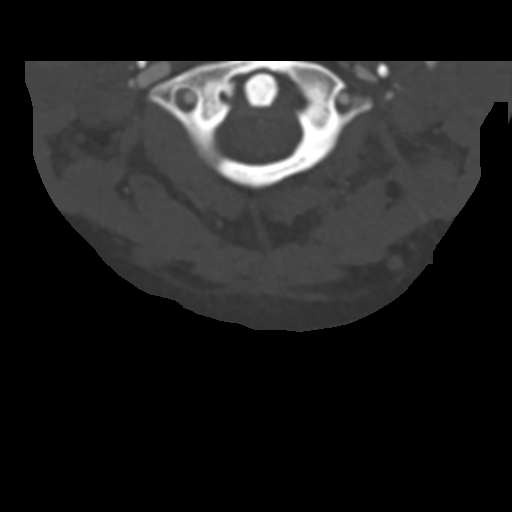

[Series 23: sagittal c spine · sagittal · 0.27mm/px · 5 of 53 slices shown, 6 images]
[im 18/53  bone]
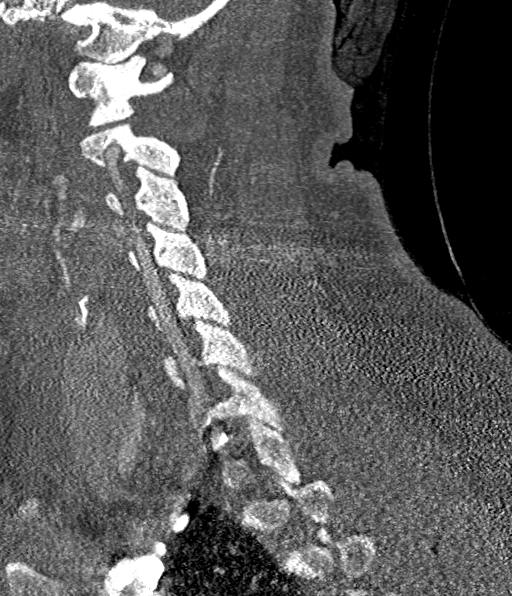
[im 22/53  bone]
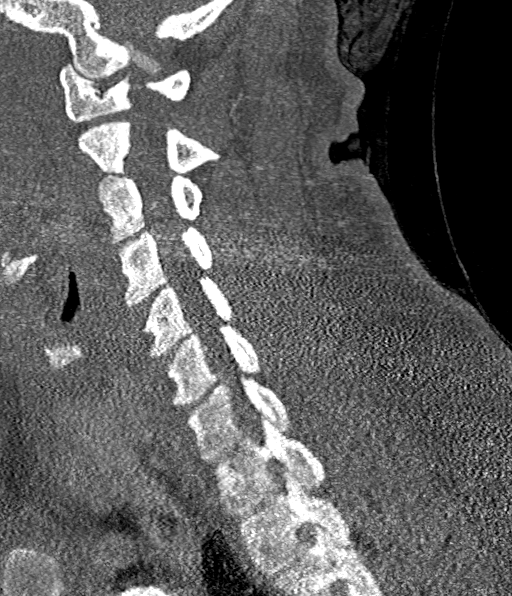
[im 27/53  soft-tissue]
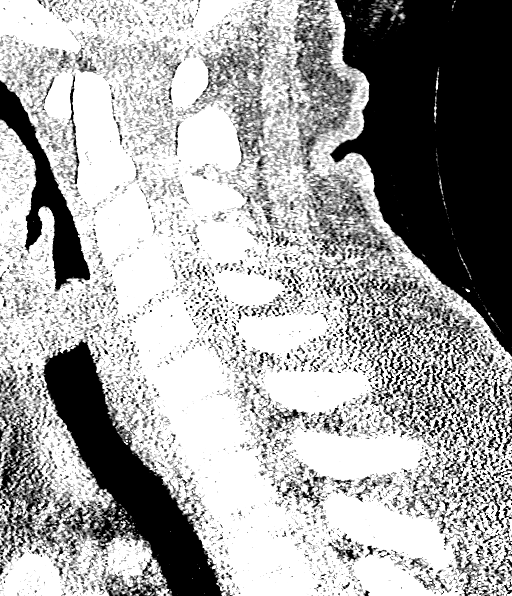
[im 27/53  bone]
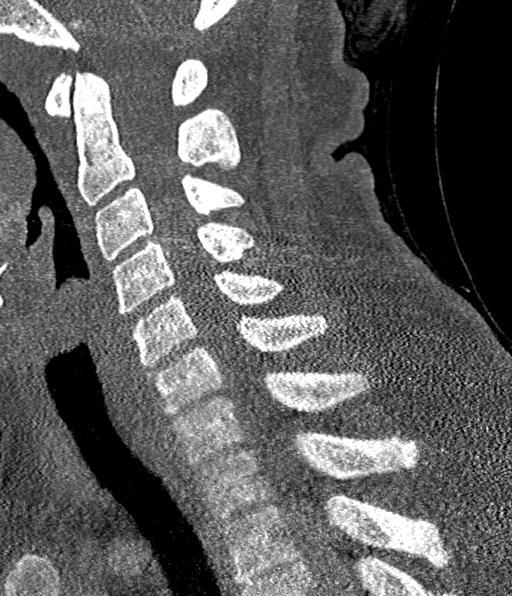
[im 31/53  bone]
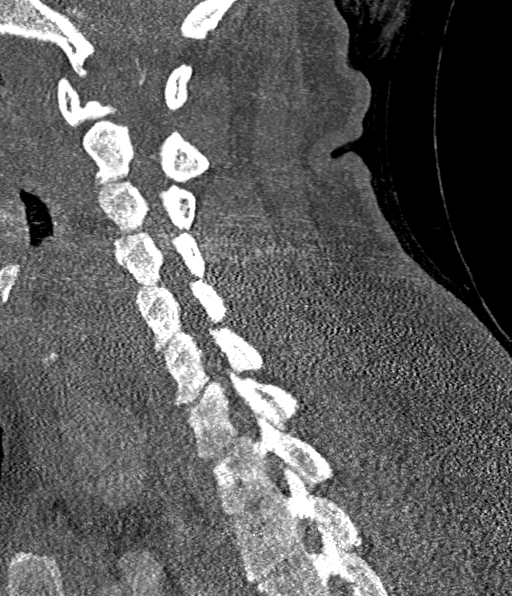
[im 35/53  bone]
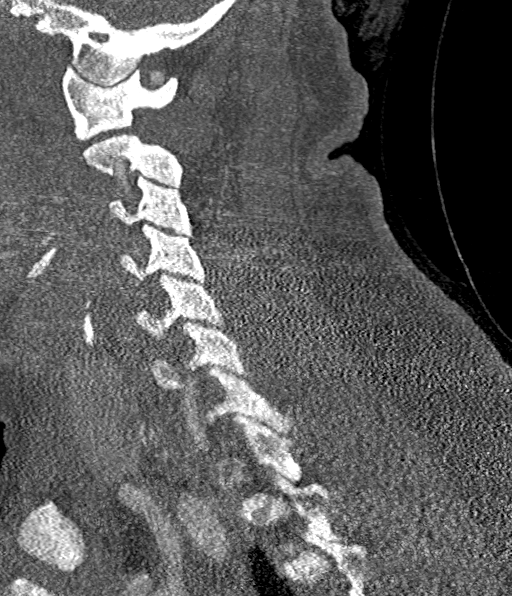

[Series 24: coronal cspine · coronal · 0.26mm/px · 3 of 44 slices shown]
[im 9/44  bone]
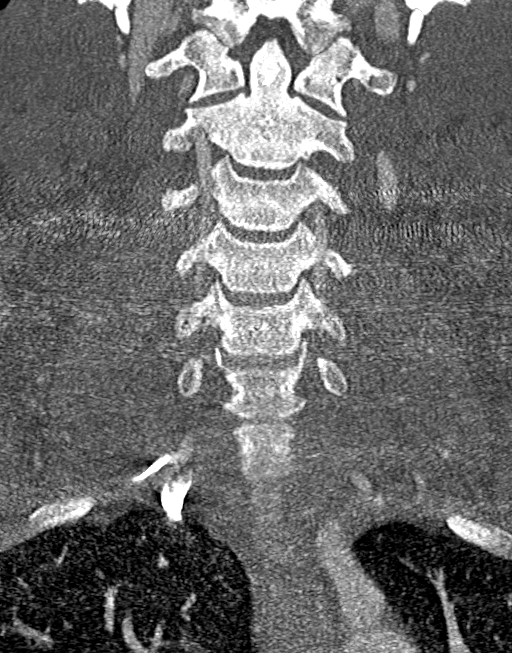
[im 18/44  bone]
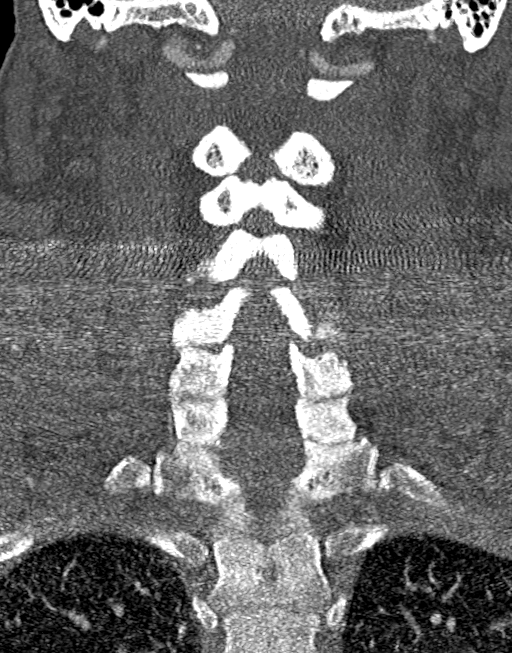
[im 26/44  bone]
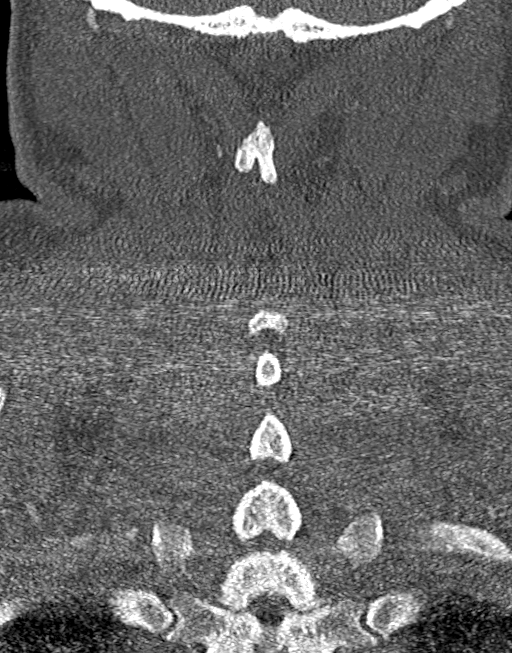

[Series 25: ax orthogonal c spine · axial · 0.21mm/px · z∈[+814,+840]mm · 2 of 82 slices shown]
[im 14/82  bone]
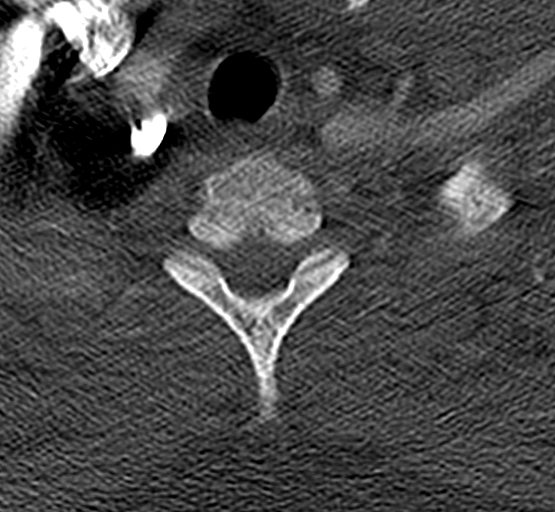
[im 28/82  bone]
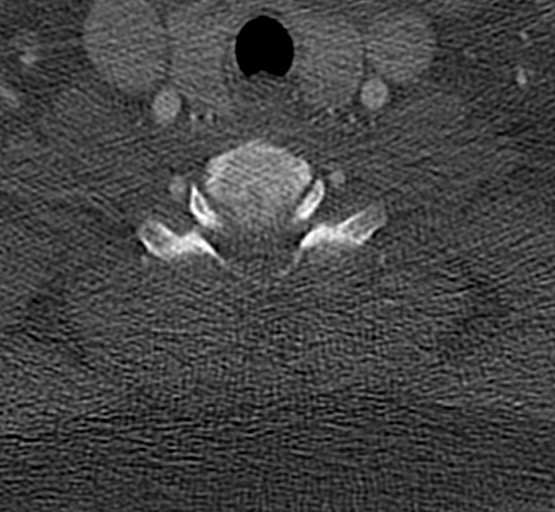

[15 of 33 positions shown; findings below may reference images not displayed]

FINDINGS: Alignment: Normal.

Skull base and vertebrae: No acute fracture. No primary bone lesion
or focal pathologic process.

Soft tissues and spinal canal: No prevertebral fluid or swelling. No
visible canal hematoma.

Disc levels: Normal endplates are seen throughout the cervical spine
with normal multilevel intervertebral disc spaces.

Upper chest: Negative.

Other: None.
IMPRESSION: No evidence of acute fracture or subluxation of the cervical spine.

## 2020-03-21 IMAGING — CT CT ANGIO NECK
1 of 11 series · 5 of 33 positions shown · IV contrast (Omnipaque)
Comparison: None.

CLINICAL DATA: Subacute head trauma.  Neuro cognitive deficit

EXAM:
CT ANGIOGRAPHY HEAD AND NECK
TECHNIQUE: Multidetector CT imaging of the head and neck was performed using
the standard protocol during bolus administration of intravenous
contrast. Multiplanar CT image reconstructions and MIPs were
obtained to evaluate the vascular anatomy. Carotid stenosis
measurements (when applicable) are obtained utilizing NASCET
criteria, using the distal internal carotid diameter as the
denominator.
CONTRAST:  100mL OMNIPAQUE IOHEXOL 350 MG/ML SOLN

[Series 10: axial thin · axial · 0.49mm/px · z∈[+802,+1029]mm · 5 of 341 slices shown]
[im 57/341  soft-tissue]
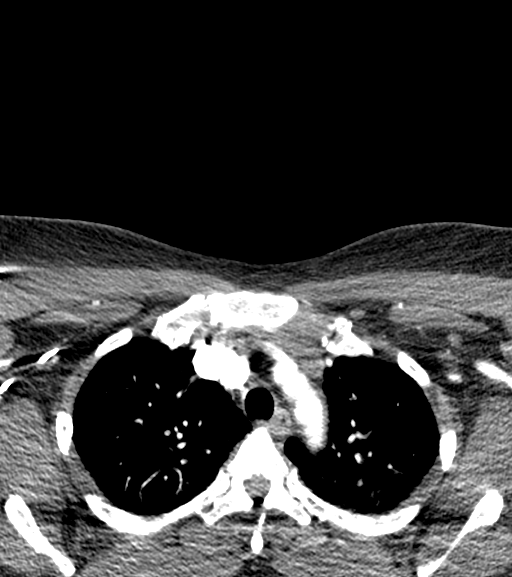
[im 114/341  bone]
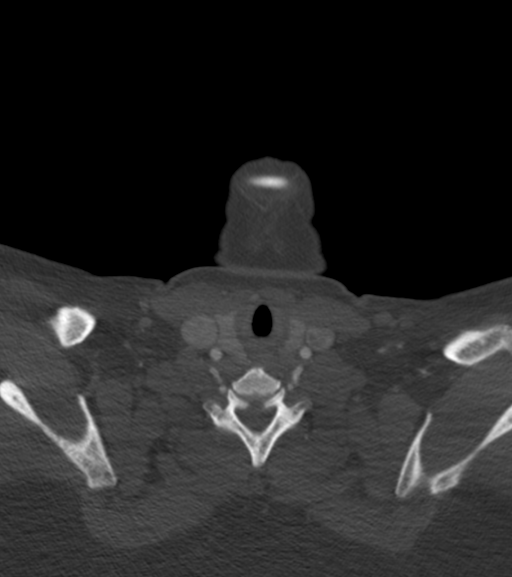
[im 171/341  soft-tissue]
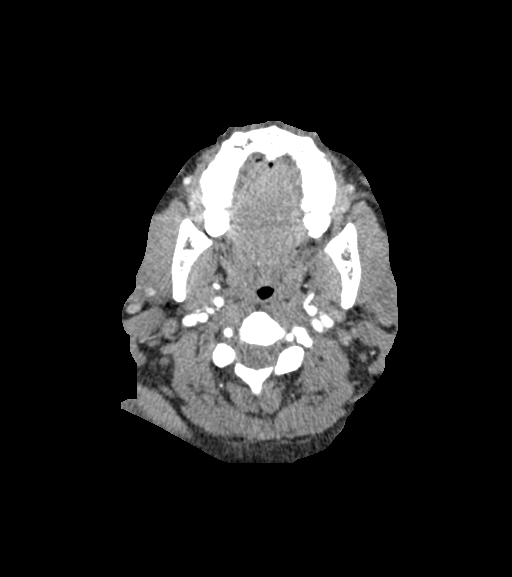
[im 227/341  bone]
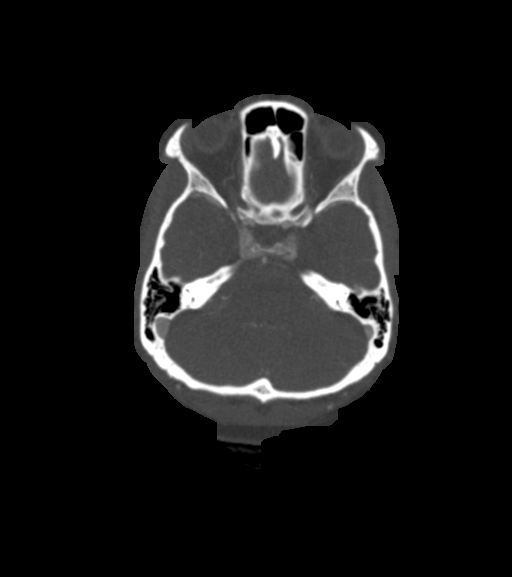
[im 284/341  soft-tissue]
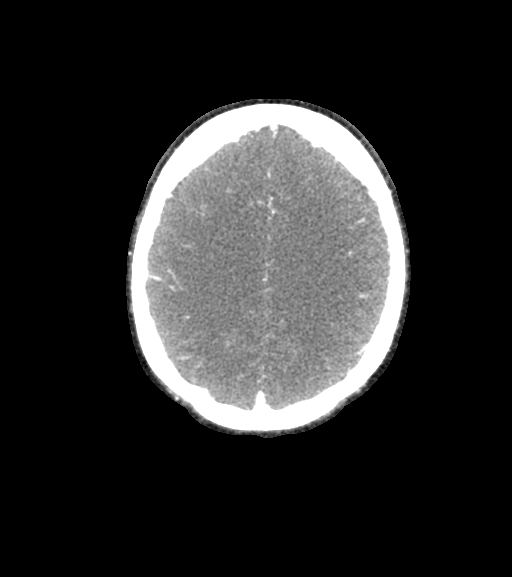

[5 of 33 positions shown; findings below may reference images not displayed]

FINDINGS: CT HEAD FINDINGS

Brain: No evidence of acute infarction, hemorrhage, hydrocephalus,
extra-axial collection or mass lesion/mass effect.

Vascular: Negative for hyperdense vessel

Skull: Negative

Sinuses: Negative

Orbits: Negative

Review of the MIP images confirms the above findings

CTA NECK FINDINGS

Aortic arch: Standard branching. Imaged portion shows no evidence of
aneurysm or dissection. No significant stenosis of the major arch
vessel origins.

Right carotid system: Normal right carotid system. Negative for
atherosclerotic disease, stenosis, or dissection.

Left carotid system: Normal left carotid system. Negative for
atherosclerotic disease, stenosis, or dissection.

Vertebral arteries: Both vertebral arteries are widely patent and
normal.

Skeleton: No acute abnormality.

Other neck: Negative for mass or soft tissue swelling.

Upper chest: Lung apices clear bilaterally.

Review of the MIP images confirms the above findings

CTA HEAD FINDINGS

Anterior circulation: Cavernous carotid is normal bilaterally.
Anterior and middle cerebral arteries normal bilaterally.

Posterior circulation: Both vertebral arteries patent to the
basilar. PICA patent bilaterally. Basilar widely patent. Superior
cerebellar and posterior cerebral arteries normal bilaterally.

Venous sinuses: Normal venous enhancement

Anatomic variants: None

Review of the MIP images confirms the above findings
IMPRESSION: 1. Negative CT head
2. Negative CTA head neck.

## 2020-03-21 IMAGING — CT CT ANGIO HEAD
1 of 11 series · 5 of 33 positions shown · IV contrast (Omnipaque)
Comparison: None.

CLINICAL DATA: Subacute head trauma.  Neuro cognitive deficit

EXAM:
CT ANGIOGRAPHY HEAD AND NECK
TECHNIQUE: Multidetector CT imaging of the head and neck was performed using
the standard protocol during bolus administration of intravenous
contrast. Multiplanar CT image reconstructions and MIPs were
obtained to evaluate the vascular anatomy. Carotid stenosis
measurements (when applicable) are obtained utilizing NASCET
criteria, using the distal internal carotid diameter as the
denominator.
CONTRAST:  100mL OMNIPAQUE IOHEXOL 350 MG/ML SOLN

[Series 10: axial thin · axial · 0.49mm/px · z∈[+802,+1029]mm · 5 of 341 slices shown]
[im 57/341  soft-tissue]
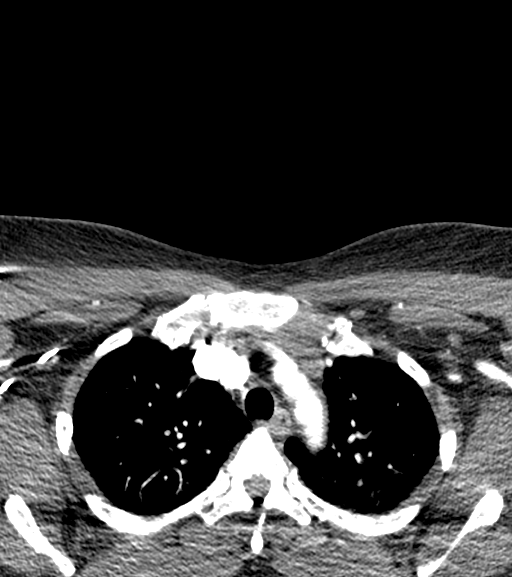
[im 114/341  bone]
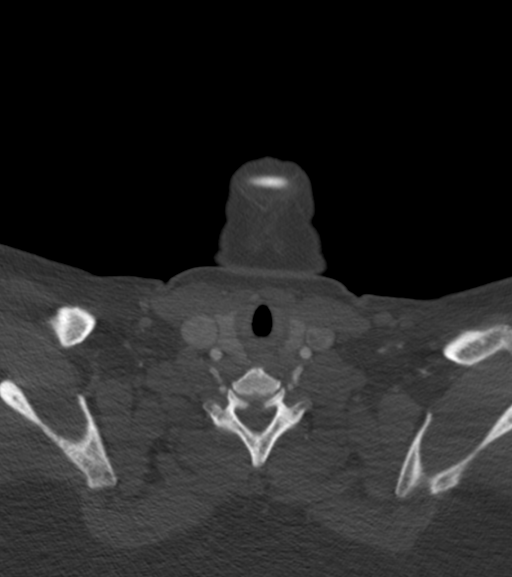
[im 171/341  soft-tissue]
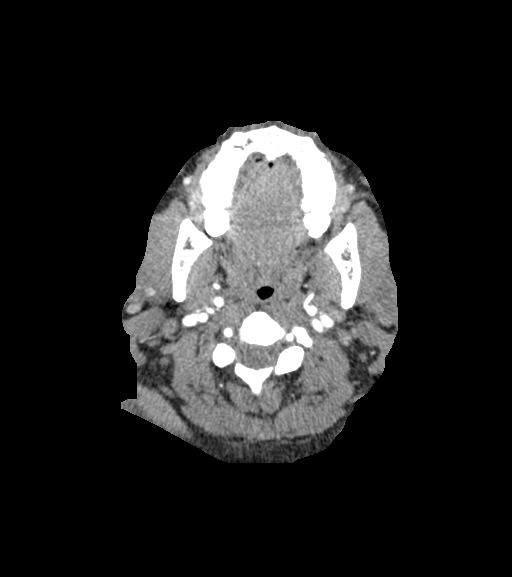
[im 227/341  bone]
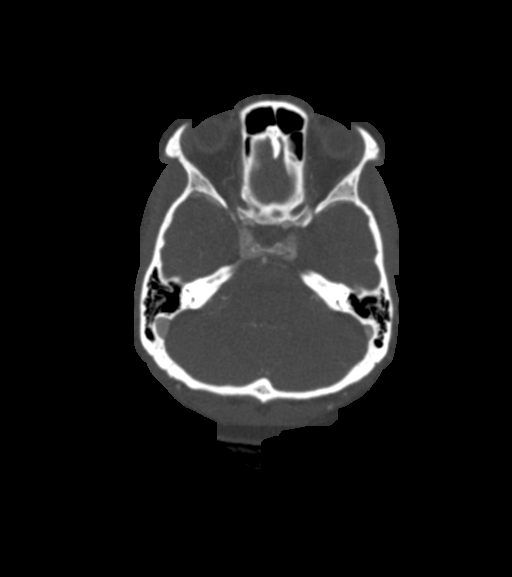
[im 284/341  soft-tissue]
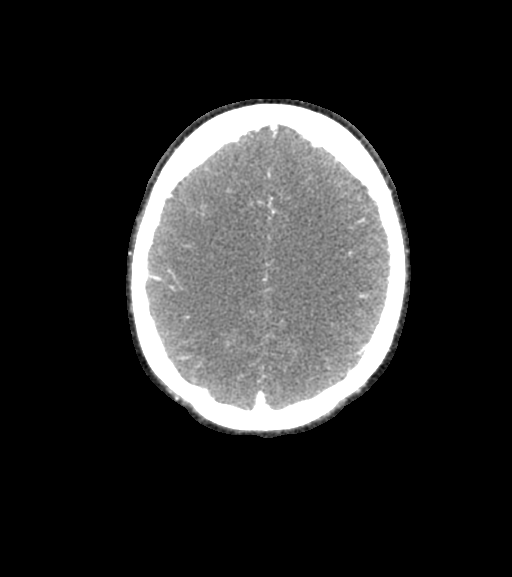

[5 of 33 positions shown; findings below may reference images not displayed]

FINDINGS: CT HEAD FINDINGS

Brain: No evidence of acute infarction, hemorrhage, hydrocephalus,
extra-axial collection or mass lesion/mass effect.

Vascular: Negative for hyperdense vessel

Skull: Negative

Sinuses: Negative

Orbits: Negative

Review of the MIP images confirms the above findings

CTA NECK FINDINGS

Aortic arch: Standard branching. Imaged portion shows no evidence of
aneurysm or dissection. No significant stenosis of the major arch
vessel origins.

Right carotid system: Normal right carotid system. Negative for
atherosclerotic disease, stenosis, or dissection.

Left carotid system: Normal left carotid system. Negative for
atherosclerotic disease, stenosis, or dissection.

Vertebral arteries: Both vertebral arteries are widely patent and
normal.

Skeleton: No acute abnormality.

Other neck: Negative for mass or soft tissue swelling.

Upper chest: Lung apices clear bilaterally.

Review of the MIP images confirms the above findings

CTA HEAD FINDINGS

Anterior circulation: Cavernous carotid is normal bilaterally.
Anterior and middle cerebral arteries normal bilaterally.

Posterior circulation: Both vertebral arteries patent to the
basilar. PICA patent bilaterally. Basilar widely patent. Superior
cerebellar and posterior cerebral arteries normal bilaterally.

Venous sinuses: Normal venous enhancement

Anatomic variants: None

Review of the MIP images confirms the above findings
IMPRESSION: 1. Negative CT head
2. Negative CTA head neck.

## 2020-03-21 MED ORDER — IOHEXOL 350 MG/ML SOLN
100.0000 mL | Freq: Once | INTRAVENOUS | Status: AC | PRN
  Administered 2020-03-21: 22:00:00 100 mL via INTRAVENOUS
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
## 2020-03-21 NOTE — Discharge Instructions (Addendum)
The neurosurgery team felt that you are safe for discharge home as you have had improvement in your symptoms.  Please follow-up with them in clinic this week and if any symptoms change or worsen, please return to nearest emergency department.

## 2020-03-22 ENCOUNTER — Emergency Department (HOSPITAL_COMMUNITY): Payer: No Typology Code available for payment source

## 2020-03-22 DIAGNOSIS — R930 Abnormal findings on diagnostic imaging of skull and head, not elsewhere classified: Secondary | ICD-10-CM | POA: Diagnosis not present

## 2020-03-22 DIAGNOSIS — S0990XA Unspecified injury of head, initial encounter: Secondary | ICD-10-CM | POA: Diagnosis not present

## 2020-03-22 DIAGNOSIS — M7918 Myalgia, other site: Secondary | ICD-10-CM | POA: Diagnosis not present

## 2020-03-22 DIAGNOSIS — S3992XA Unspecified injury of lower back, initial encounter: Secondary | ICD-10-CM | POA: Diagnosis not present

## 2020-03-22 DIAGNOSIS — S299XXA Unspecified injury of thorax, initial encounter: Secondary | ICD-10-CM | POA: Diagnosis not present

## 2020-03-22 DIAGNOSIS — R29898 Other symptoms and signs involving the musculoskeletal system: Secondary | ICD-10-CM | POA: Diagnosis not present

## 2020-03-22 DIAGNOSIS — R531 Weakness: Secondary | ICD-10-CM | POA: Diagnosis not present

## 2020-03-22 DIAGNOSIS — G959 Disease of spinal cord, unspecified: Secondary | ICD-10-CM | POA: Diagnosis not present

## 2020-03-22 DIAGNOSIS — M4802 Spinal stenosis, cervical region: Secondary | ICD-10-CM | POA: Diagnosis not present

## 2020-03-22 DIAGNOSIS — R519 Headache, unspecified: Secondary | ICD-10-CM | POA: Diagnosis not present

## 2020-03-22 DIAGNOSIS — S199XXA Unspecified injury of neck, initial encounter: Secondary | ICD-10-CM | POA: Diagnosis not present

## 2020-03-22 DIAGNOSIS — Z79899 Other long term (current) drug therapy: Secondary | ICD-10-CM | POA: Diagnosis not present
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
## 2020-03-22 MED ORDER — METOCLOPRAMIDE HCL 5 MG/ML IJ SOLN
10.0000 mg | Freq: Once | INTRAMUSCULAR | Status: AC
  Administered 2020-03-22: 10 mg via INTRAVENOUS
  Filled 2020-03-22: qty 2

## 2020-03-22 MED ORDER — DEXAMETHASONE SODIUM PHOSPHATE 10 MG/ML IJ SOLN
10.0000 mg | Freq: Once | INTRAMUSCULAR | Status: AC
  Administered 2020-03-22: 10 mg via INTRAVENOUS
  Filled 2020-03-22: qty 1

## 2020-03-22 MED ORDER — KETOROLAC TROMETHAMINE 30 MG/ML IJ SOLN: 30.0000 mg | Freq: Once | INTRAMUSCULAR | Status: DC

## 2020-03-22 MED ORDER — DIPHENHYDRAMINE HCL 50 MG/ML IJ SOLN
25.0000 mg | Freq: Once | INTRAMUSCULAR | Status: AC
  Administered 2020-03-22: 25 mg via INTRAVENOUS
  Filled 2020-03-22: qty 1
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
## 2020-03-22 NOTE — ED Notes (Signed)
[phone] pts fiance Arnetha Massy would like an update
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
## 2020-03-22 NOTE — ED Notes (Signed)
[place] applied prior to going to [facility]

## 2020-03-26 DIAGNOSIS — M4802 Spinal stenosis, cervical region: Secondary | ICD-10-CM | POA: Diagnosis not present

## 2020-03-26 MED FILL — CYCLOBENZAPRINE HCL 10 MG T: 10 | 10 days supply | Qty: 30 | Fill #0

## 2020-04-24 DIAGNOSIS — M4802 Spinal stenosis, cervical region: Secondary | ICD-10-CM | POA: Diagnosis not present

## 2020-07-06 DIAGNOSIS — Z20828 Contact with and (suspected) exposure to other viral communicable diseases: Secondary | ICD-10-CM | POA: Diagnosis not present

## 2020-08-28 DIAGNOSIS — R102 Pelvic and perineal pain: Secondary | ICD-10-CM | POA: Diagnosis not present

## 2020-08-28 DIAGNOSIS — Z01419 Encounter for gynecological examination (general) (routine) without abnormal findings: Secondary | ICD-10-CM | POA: Diagnosis not present

## 2020-10-02 DIAGNOSIS — E669 Obesity, unspecified: Secondary | ICD-10-CM | POA: Diagnosis not present

## 2020-10-02 DIAGNOSIS — Z13220 Encounter for screening for lipoid disorders: Secondary | ICD-10-CM | POA: Diagnosis not present

## 2020-10-02 DIAGNOSIS — M25551 Pain in right hip: Secondary | ICD-10-CM | POA: Diagnosis not present

## 2020-10-02 DIAGNOSIS — Z0000 Encounter for general adult medical examination without abnormal findings: Secondary | ICD-10-CM | POA: Diagnosis not present

## 2020-10-02 DIAGNOSIS — R7301 Impaired fasting glucose: Secondary | ICD-10-CM | POA: Diagnosis not present

## 2020-10-02 DIAGNOSIS — E559 Vitamin D deficiency, unspecified: Secondary | ICD-10-CM | POA: Diagnosis not present

## 2020-10-21 DIAGNOSIS — M25551 Pain in right hip: Secondary | ICD-10-CM | POA: Diagnosis not present

## 2021-01-26 ENCOUNTER — Other Ambulatory Visit (HOSPITAL_COMMUNITY): Payer: Self-pay | Admitting: Sports Medicine

## 2021-01-26 DIAGNOSIS — M25551 Pain in right hip: Secondary | ICD-10-CM | POA: Diagnosis not present

## 2021-01-26 DIAGNOSIS — M25552 Pain in left hip: Secondary | ICD-10-CM | POA: Diagnosis not present

## 2021-01-26 MED FILL — NITROGLYCERIN 0.2 MG/HR PTC: 0.2 | 30 days supply | Qty: 30 | Fill #0

## 2021-03-10 DIAGNOSIS — M25551 Pain in right hip: Secondary | ICD-10-CM | POA: Diagnosis not present

## 2021-03-10 DIAGNOSIS — M25552 Pain in left hip: Secondary | ICD-10-CM | POA: Diagnosis not present

## 2021-06-10 DIAGNOSIS — M25552 Pain in left hip: Secondary | ICD-10-CM | POA: Diagnosis not present

## 2021-08-28 DIAGNOSIS — R87610 Atypical squamous cells of undetermined significance on cytologic smear of cervix (ASC-US): Secondary | ICD-10-CM | POA: Diagnosis not present

## 2021-08-28 DIAGNOSIS — Z01419 Encounter for gynecological examination (general) (routine) without abnormal findings: Secondary | ICD-10-CM | POA: Diagnosis not present

## 2021-10-05 ENCOUNTER — Other Ambulatory Visit: Payer: Self-pay | Admitting: Obstetrics and Gynecology

## 2021-10-05 DIAGNOSIS — Z1231 Encounter for screening mammogram for malignant neoplasm of breast: Secondary | ICD-10-CM

## 2021-10-20 DIAGNOSIS — E559 Vitamin D deficiency, unspecified: Secondary | ICD-10-CM | POA: Diagnosis not present

## 2021-10-20 DIAGNOSIS — R7309 Other abnormal glucose: Secondary | ICD-10-CM | POA: Diagnosis not present

## 2021-10-20 DIAGNOSIS — E669 Obesity, unspecified: Secondary | ICD-10-CM | POA: Diagnosis not present

## 2021-10-20 DIAGNOSIS — Z0000 Encounter for general adult medical examination without abnormal findings: Secondary | ICD-10-CM | POA: Diagnosis not present

## 2021-10-20 DIAGNOSIS — Z1211 Encounter for screening for malignant neoplasm of colon: Secondary | ICD-10-CM | POA: Diagnosis not present

## 2021-11-11 ENCOUNTER — Ambulatory Visit: Payer: 59

## 2021-11-12 ENCOUNTER — Ambulatory Visit
Admission: RE | Admit: 2021-11-12 | Discharge: 2021-11-12 | Disposition: A | Discharge status: Home / Self Care | Payer: 59 | Source: Ambulatory Visit | Attending: Obstetrics and Gynecology | Admitting: Obstetrics and Gynecology

## 2021-11-12 DIAGNOSIS — Z1231 Encounter for screening mammogram for malignant neoplasm of breast: Secondary | ICD-10-CM | POA: Diagnosis not present

## 2021-11-12 IMAGING — MG MM DIGITAL SCREENING BILAT W/ TOMO AND CAD
8 series · 8 of 24 positions shown · non-contrast
Comparison: Previous exam(s).

CLINICAL DATA: Screening.

EXAM:
DIGITAL SCREENING BILATERAL MAMMOGRAM WITH TOMOSYNTHESIS AND CAD
TECHNIQUE: Bilateral screening digital craniocaudal and mediolateral oblique
mammograms were obtained. Bilateral screening digital breast
tomosynthesis was performed. The images were evaluated with
computer-aided detection.

[R MLO synth-2D]
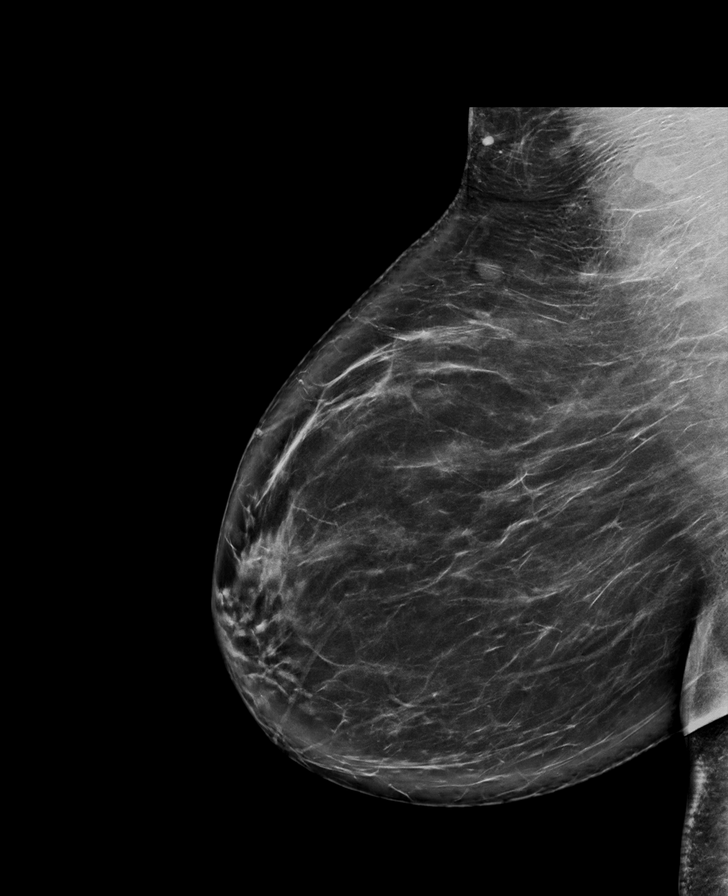

[L CC synth-2D]
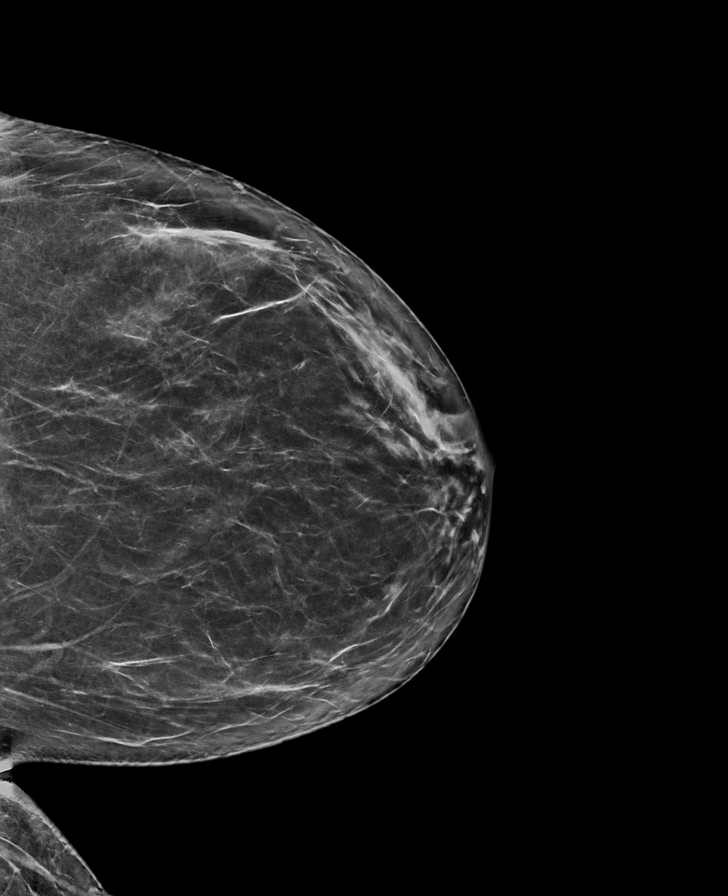

[L MLO synth-2D]
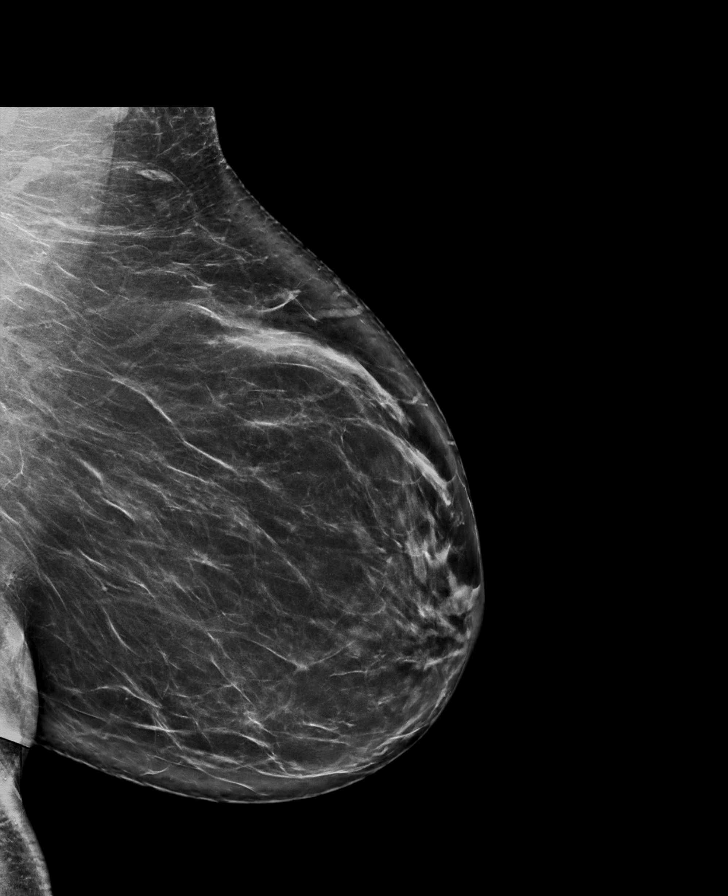

[R CC synth-2D]
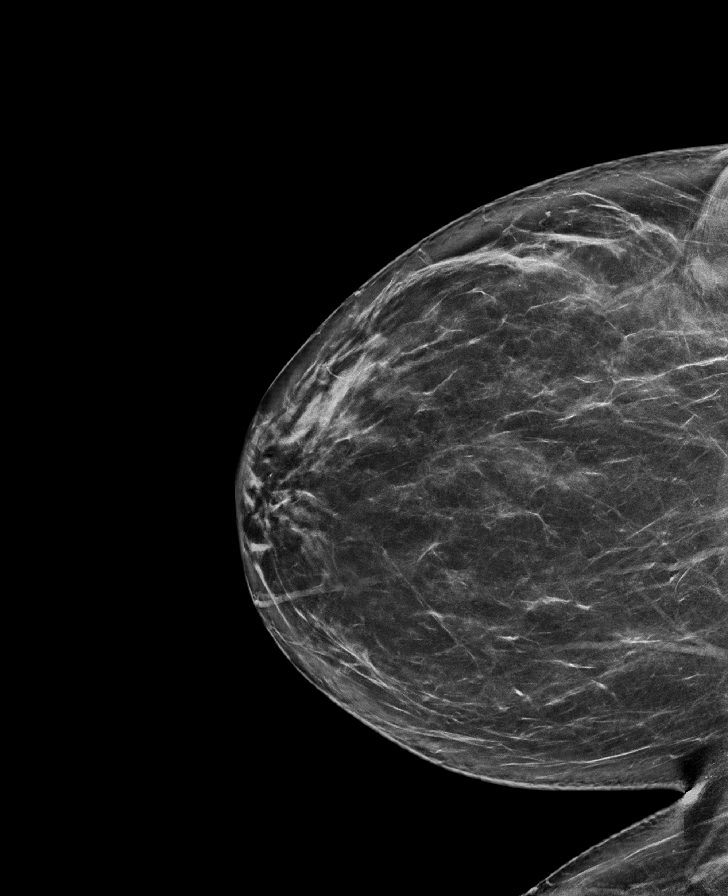

[L CC tomo · tomo slice 39/77.0]
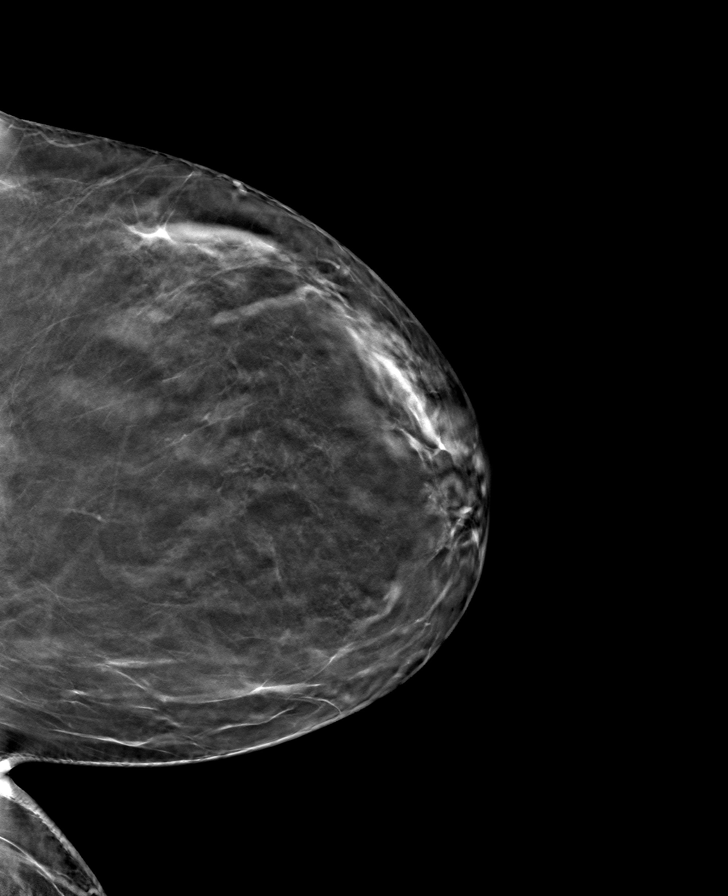

[L MLO tomo · tomo slice 43/86.0]
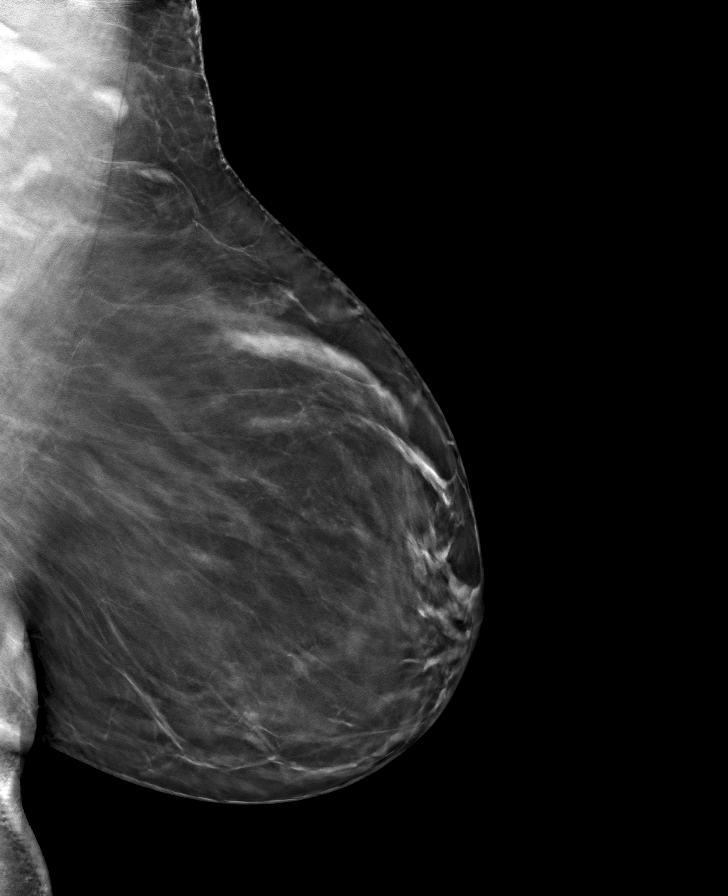

[R CC tomo · tomo slice 42/83.0]
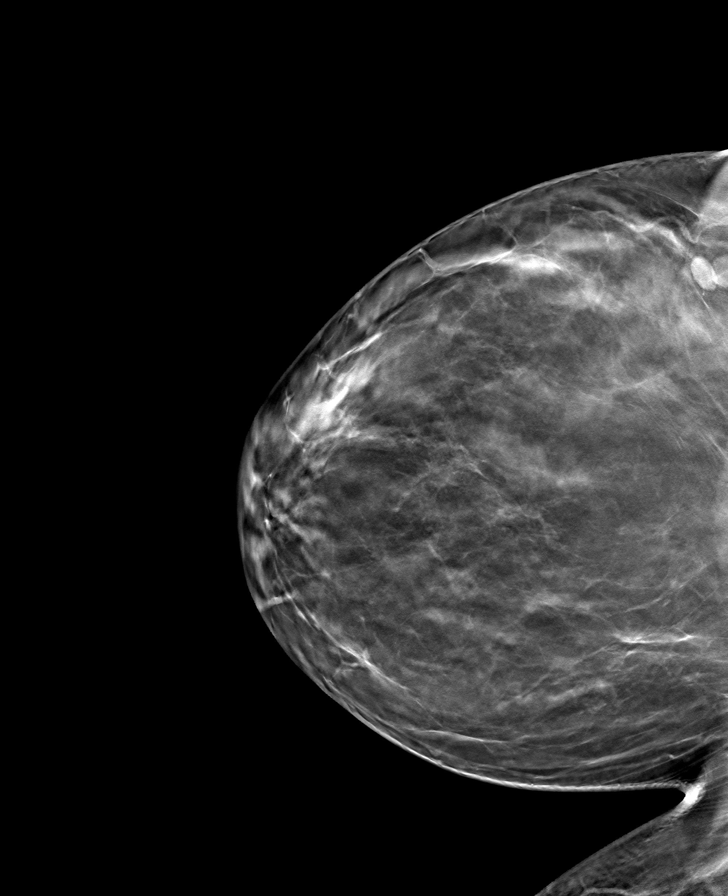

[R MLO tomo · tomo slice 49/98.0]
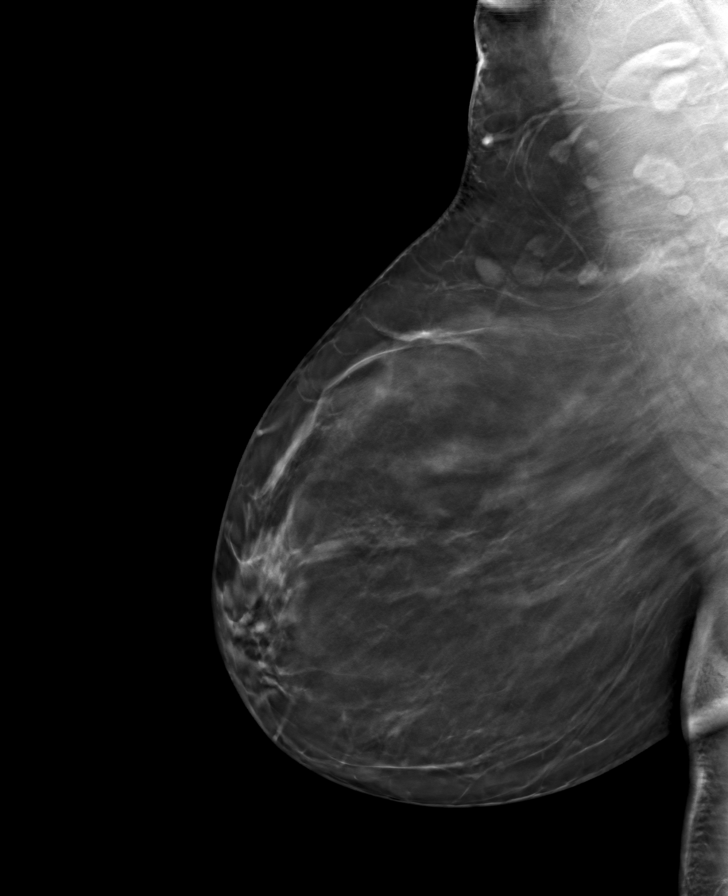

[8 of 24 positions shown; findings below may reference images not displayed]

ACR Breast Density Category b: There are scattered areas of
fibroglandular density.
FINDINGS: There are no findings suspicious for malignancy.
IMPRESSION: No mammographic evidence of malignancy. A result letter of this
screening mammogram will be mailed directly to the patient.

RECOMMENDATION:
Screening mammogram in one year. (Code:[BY])

BI-RADS CATEGORY  1: Negative.

## 2022-01-11 DIAGNOSIS — E2839 Other primary ovarian failure: Secondary | ICD-10-CM | POA: Diagnosis not present

## 2022-01-18 ENCOUNTER — Other Ambulatory Visit (HOSPITAL_COMMUNITY): Payer: Self-pay

## 2022-01-18 DIAGNOSIS — N8500 Endometrial hyperplasia, unspecified: Secondary | ICD-10-CM | POA: Diagnosis not present

## 2022-01-18 DIAGNOSIS — N979 Female infertility, unspecified: Secondary | ICD-10-CM | POA: Diagnosis not present

## 2022-01-18 DIAGNOSIS — E288 Other ovarian dysfunction: Secondary | ICD-10-CM | POA: Diagnosis not present

## 2022-01-18 DIAGNOSIS — E2839 Other primary ovarian failure: Secondary | ICD-10-CM | POA: Diagnosis not present

## 2022-01-18 DIAGNOSIS — N711 Chronic inflammatory disease of uterus: Secondary | ICD-10-CM | POA: Diagnosis not present

## 2022-01-18 DIAGNOSIS — Z3183 Encounter for assisted reproductive fertility procedure cycle: Secondary | ICD-10-CM | POA: Diagnosis not present

## 2022-01-18 DIAGNOSIS — Z113 Encounter for screening for infections with a predominantly sexual mode of transmission: Secondary | ICD-10-CM | POA: Diagnosis not present

## 2022-01-18 MED ORDER — DOXYCYCLINE HYCLATE 100 MG PO CAPS
100.0000 mg | ORAL_CAPSULE | Freq: Two times a day (BID) | ORAL | 0 refills | Status: AC
  Filled 2022-01-18: qty 10, 5d supply, fill #0

## 2022-01-19 DIAGNOSIS — E2839 Other primary ovarian failure: Secondary | ICD-10-CM | POA: Diagnosis not present

## 2022-01-26 ENCOUNTER — Other Ambulatory Visit (HOSPITAL_COMMUNITY): Payer: Self-pay

## 2022-01-29 DIAGNOSIS — N978 Female infertility of other origin: Secondary | ICD-10-CM | POA: Diagnosis not present

## 2022-01-29 DIAGNOSIS — Z3169 Encounter for other general counseling and advice on procreation: Secondary | ICD-10-CM | POA: Diagnosis not present

## 2022-01-29 DIAGNOSIS — Z9889 Other specified postprocedural states: Secondary | ICD-10-CM | POA: Diagnosis not present

## 2022-02-05 DIAGNOSIS — N978 Female infertility of other origin: Secondary | ICD-10-CM | POA: Diagnosis not present

## 2022-02-05 DIAGNOSIS — Z3169 Encounter for other general counseling and advice on procreation: Secondary | ICD-10-CM | POA: Diagnosis not present

## 2022-02-19 ENCOUNTER — Other Ambulatory Visit (HOSPITAL_COMMUNITY): Payer: Self-pay | Admitting: Maternal and Fetal Medicine

## 2022-02-19 DIAGNOSIS — N883 Incompetence of cervix uteri: Secondary | ICD-10-CM

## 2022-02-19 DIAGNOSIS — Z3169 Encounter for other general counseling and advice on procreation: Secondary | ICD-10-CM

## 2022-02-24 ENCOUNTER — Telehealth (HOSPITAL_COMMUNITY): Payer: Self-pay | Admitting: *Deleted

## 2022-02-24 NOTE — Telephone Encounter (Signed)
Close encounter 

## 2022-02-25 ENCOUNTER — Ambulatory Visit (HOSPITAL_COMMUNITY)
Admission: RE | Admit: 2022-02-25 | Discharge: 2022-02-25 | Disposition: A | Discharge status: Home / Self Care | Payer: 59 | Source: Ambulatory Visit | Attending: Internal Medicine | Admitting: Internal Medicine

## 2022-02-25 DIAGNOSIS — Z3169 Encounter for other general counseling and advice on procreation: Secondary | ICD-10-CM

## 2022-02-25 DIAGNOSIS — N883 Incompetence of cervix uteri: Secondary | ICD-10-CM | POA: Diagnosis not present

## 2022-02-25 DIAGNOSIS — Z01818 Encounter for other preprocedural examination: Secondary | ICD-10-CM

## 2022-02-25 DIAGNOSIS — N978 Female infertility of other origin: Secondary | ICD-10-CM

## 2022-02-25 LAB — EXERCISE TOLERANCE TEST
Angina Index: 0
Duke Treadmill Score: 8
Estimated workload: 9.4
Exercise duration (min): 7 min
Exercise duration (sec): 35 s
MPHR: 172 {beats}/min
Peak HR: 164 {beats}/min
Percent HR: 95 %
Rest HR: 57 {beats}/min
ST Depression (mm): 0 mm

## 2022-03-30 ENCOUNTER — Other Ambulatory Visit (HOSPITAL_COMMUNITY): Payer: Self-pay

## 2022-03-30 MED ORDER — METRONIDAZOLE 500 MG PO TABS
500.0000 mg | ORAL_TABLET | Freq: Two times a day (BID) | ORAL | 0 refills | Status: AC
  Filled 2022-03-30: qty 20, 10d supply, fill #0

## 2022-03-30 MED ORDER — CIPROFLOXACIN HCL 500 MG PO TABS
500.0000 mg | ORAL_TABLET | Freq: Two times a day (BID) | ORAL | 0 refills | Status: AC
  Filled 2022-03-30: qty 20, 10d supply, fill #0

## 2022-03-31 ENCOUNTER — Other Ambulatory Visit (HOSPITAL_COMMUNITY): Payer: Self-pay

## 2022-04-12 DIAGNOSIS — Z9889 Other specified postprocedural states: Secondary | ICD-10-CM | POA: Diagnosis not present

## 2022-04-12 DIAGNOSIS — Z3169 Encounter for other general counseling and advice on procreation: Secondary | ICD-10-CM | POA: Diagnosis not present

## 2022-04-12 DIAGNOSIS — N978 Female infertility of other origin: Secondary | ICD-10-CM | POA: Diagnosis not present

## 2022-06-08 ENCOUNTER — Other Ambulatory Visit (HOSPITAL_COMMUNITY): Payer: Self-pay

## 2022-06-08 MED ORDER — PEG 3350-KCL-NA BICARB-NACL 420 G PO SOLR
ORAL | 0 refills | Status: AC
  Filled 2022-06-08: qty 4000, 1d supply, fill #0

## 2022-06-09 ENCOUNTER — Other Ambulatory Visit (HOSPITAL_COMMUNITY): Payer: Self-pay

## 2022-07-22 DIAGNOSIS — K602 Anal fissure, unspecified: Secondary | ICD-10-CM | POA: Diagnosis not present

## 2022-07-22 DIAGNOSIS — Z1211 Encounter for screening for malignant neoplasm of colon: Secondary | ICD-10-CM | POA: Diagnosis not present

## 2022-07-22 DIAGNOSIS — K648 Other hemorrhoids: Secondary | ICD-10-CM | POA: Diagnosis not present

## 2022-08-05 ENCOUNTER — Other Ambulatory Visit (HOSPITAL_COMMUNITY): Payer: Self-pay

## 2022-08-05 DIAGNOSIS — Z3141 Encounter for fertility testing: Secondary | ICD-10-CM | POA: Diagnosis not present

## 2022-08-05 DIAGNOSIS — N979 Female infertility, unspecified: Secondary | ICD-10-CM | POA: Diagnosis not present

## 2022-08-05 DIAGNOSIS — Z3183 Encounter for assisted reproductive fertility procedure cycle: Secondary | ICD-10-CM | POA: Diagnosis not present

## 2022-08-05 DIAGNOSIS — N711 Chronic inflammatory disease of uterus: Secondary | ICD-10-CM | POA: Diagnosis not present

## 2022-08-05 DIAGNOSIS — E288 Other ovarian dysfunction: Secondary | ICD-10-CM | POA: Diagnosis not present

## 2022-08-05 DIAGNOSIS — Z113 Encounter for screening for infections with a predominantly sexual mode of transmission: Secondary | ICD-10-CM | POA: Diagnosis not present

## 2022-08-05 MED ORDER — DOXYCYCLINE HYCLATE 100 MG PO CAPS
100.0000 mg | ORAL_CAPSULE | Freq: Two times a day (BID) | ORAL | 0 refills | Status: AC
  Filled 2022-08-05: qty 10, 5d supply, fill #0

## 2022-08-05 MED ORDER — ESTRADIOL 2 MG PO TABS
2.0000 mg | ORAL_TABLET | Freq: Two times a day (BID) | ORAL | 3 refills | Status: AC
  Filled 2022-08-05: qty 60, 30d supply, fill #0
  Filled 2022-09-29: qty 60, 30d supply, fill #1

## 2022-08-05 MED ORDER — ESTRADIOL 0.1 MG/24HR TD PTTW
1.0000 | MEDICATED_PATCH | TRANSDERMAL | 3 refills | Status: AC
  Filled 2022-08-05: qty 8, 28d supply, fill #0

## 2022-08-05 MED ORDER — "BD LUER-LOK SYRINGE 18G X 1-1/2"" 3 ML MISC"
3 refills | Status: AC
  Filled 2022-08-05: qty 30, 30d supply, fill #0

## 2022-08-05 MED ORDER — PROGESTERONE 50 MG/ML IM OIL
50.0000 mg | TOPICAL_OIL | Freq: Every day | INTRAMUSCULAR | 3 refills | Status: AC
  Filled 2022-08-05: qty 30, 30d supply, fill #0

## 2022-08-05 MED ORDER — "BD HYPODERMIC NEEDLE 22G X 1-1/2"" MISC"
3 refills | Status: AC
  Filled 2022-08-05: qty 30, 30d supply, fill #0

## 2022-08-05 MED ORDER — METHYLPREDNISOLONE 8 MG PO TABS
8.0000 mg | ORAL_TABLET | Freq: Two times a day (BID) | ORAL | 1 refills | Status: AC
  Filled 2022-08-05: qty 8, 4d supply, fill #0
  Filled 2022-09-29: qty 8, 4d supply, fill #1

## 2022-08-31 ENCOUNTER — Other Ambulatory Visit: Payer: Self-pay | Admitting: Obstetrics and Gynecology

## 2022-08-31 ENCOUNTER — Other Ambulatory Visit (HOSPITAL_COMMUNITY)
Admission: RE | Admit: 2022-08-31 | Discharge: 2022-08-31 | Disposition: A | Discharge status: Home / Self Care | Payer: 59 | Source: Ambulatory Visit | Attending: Obstetrics and Gynecology | Admitting: Obstetrics and Gynecology

## 2022-08-31 DIAGNOSIS — Z01419 Encounter for gynecological examination (general) (routine) without abnormal findings: Secondary | ICD-10-CM | POA: Insufficient documentation

## 2022-09-01 LAB — CYTOLOGY - PAP
Comment: NEGATIVE
Diagnosis: NEGATIVE
High risk HPV: NEGATIVE

## 2022-09-02 DIAGNOSIS — Z3141 Encounter for fertility testing: Secondary | ICD-10-CM | POA: Diagnosis not present

## 2022-09-02 DIAGNOSIS — Z3183 Encounter for assisted reproductive fertility procedure cycle: Secondary | ICD-10-CM | POA: Diagnosis not present

## 2022-09-09 ENCOUNTER — Other Ambulatory Visit (HOSPITAL_COMMUNITY): Payer: Self-pay

## 2022-09-09 DIAGNOSIS — N856 Intrauterine synechiae: Secondary | ICD-10-CM | POA: Diagnosis not present

## 2022-09-09 DIAGNOSIS — N711 Chronic inflammatory disease of uterus: Secondary | ICD-10-CM | POA: Diagnosis not present

## 2022-09-09 MED ORDER — NORETHINDRONE ACETATE 5 MG PO TABS
5.0000 mg | ORAL_TABLET | Freq: Every morning | ORAL | 1 refills | Status: AC
  Filled 2022-09-09: qty 30, 30d supply, fill #0

## 2022-09-14 ENCOUNTER — Other Ambulatory Visit (HOSPITAL_COMMUNITY): Payer: Self-pay

## 2022-09-14 MED ORDER — ESTRADIOL 0.1 MG/24HR TD PTTW
1.0000 | MEDICATED_PATCH | TRANSDERMAL | 3 refills | Status: AC
  Filled 2022-09-14: qty 8, 28d supply, fill #0

## 2022-09-27 DIAGNOSIS — Z3183 Encounter for assisted reproductive fertility procedure cycle: Secondary | ICD-10-CM | POA: Diagnosis not present

## 2022-09-27 DIAGNOSIS — Z3141 Encounter for fertility testing: Secondary | ICD-10-CM | POA: Diagnosis not present

## 2022-09-29 ENCOUNTER — Other Ambulatory Visit (HOSPITAL_COMMUNITY): Payer: Self-pay

## 2022-10-01 DIAGNOSIS — Z3183 Encounter for assisted reproductive fertility procedure cycle: Secondary | ICD-10-CM | POA: Diagnosis not present

## 2022-10-06 DIAGNOSIS — Z3183 Encounter for assisted reproductive fertility procedure cycle: Secondary | ICD-10-CM | POA: Diagnosis not present

## 2022-10-07 DIAGNOSIS — Z3200 Encounter for pregnancy test, result unknown: Secondary | ICD-10-CM | POA: Diagnosis not present

## 2022-10-13 DIAGNOSIS — Z3200 Encounter for pregnancy test, result unknown: Secondary | ICD-10-CM | POA: Diagnosis not present

## 2023-01-27 DIAGNOSIS — Z13220 Encounter for screening for lipoid disorders: Secondary | ICD-10-CM | POA: Diagnosis not present

## 2023-01-27 DIAGNOSIS — E559 Vitamin D deficiency, unspecified: Secondary | ICD-10-CM | POA: Diagnosis not present

## 2023-01-27 DIAGNOSIS — R7309 Other abnormal glucose: Secondary | ICD-10-CM | POA: Diagnosis not present

## 2023-01-27 DIAGNOSIS — Z6834 Body mass index (BMI) 34.0-34.9, adult: Secondary | ICD-10-CM | POA: Diagnosis not present

## 2023-01-27 DIAGNOSIS — Z0000 Encounter for general adult medical examination without abnormal findings: Secondary | ICD-10-CM | POA: Diagnosis not present

## 2023-07-05 DIAGNOSIS — N982 Complications of attempted introduction of fertilized ovum following in vitro fertilization: Secondary | ICD-10-CM | POA: Diagnosis not present

## 2023-07-05 DIAGNOSIS — Z3169 Encounter for other general counseling and advice on procreation: Secondary | ICD-10-CM | POA: Diagnosis not present

## 2023-09-06 DIAGNOSIS — Z124 Encounter for screening for malignant neoplasm of cervix: Secondary | ICD-10-CM | POA: Diagnosis not present

## 2023-09-06 DIAGNOSIS — Z01419 Encounter for gynecological examination (general) (routine) without abnormal findings: Secondary | ICD-10-CM | POA: Diagnosis not present

## 2023-09-29 ENCOUNTER — Other Ambulatory Visit: Payer: Self-pay | Admitting: Obstetrics and Gynecology

## 2023-09-29 DIAGNOSIS — Z1231 Encounter for screening mammogram for malignant neoplasm of breast: Secondary | ICD-10-CM

## 2023-11-18 ENCOUNTER — Ambulatory Visit
Admission: RE | Admit: 2023-11-18 | Discharge: 2023-11-18 | Disposition: A | Discharge status: Home / Self Care | Payer: Commercial Managed Care - PPO | Source: Ambulatory Visit | Attending: Obstetrics and Gynecology | Admitting: Obstetrics and Gynecology

## 2023-11-18 DIAGNOSIS — Z1231 Encounter for screening mammogram for malignant neoplasm of breast: Secondary | ICD-10-CM | POA: Diagnosis not present

## 2024-02-15 ENCOUNTER — Other Ambulatory Visit: Payer: Self-pay

## 2024-02-15 ENCOUNTER — Other Ambulatory Visit (HOSPITAL_COMMUNITY): Payer: Self-pay

## 2024-02-15 DIAGNOSIS — E559 Vitamin D deficiency, unspecified: Secondary | ICD-10-CM | POA: Diagnosis not present

## 2024-02-15 DIAGNOSIS — R5383 Other fatigue: Secondary | ICD-10-CM | POA: Diagnosis not present

## 2024-02-15 DIAGNOSIS — R7309 Other abnormal glucose: Secondary | ICD-10-CM | POA: Diagnosis not present

## 2024-02-15 DIAGNOSIS — Z0000 Encounter for general adult medical examination without abnormal findings: Secondary | ICD-10-CM | POA: Diagnosis not present

## 2024-02-15 DIAGNOSIS — Z6835 Body mass index (BMI) 35.0-35.9, adult: Secondary | ICD-10-CM | POA: Diagnosis not present

## 2024-02-15 MED ORDER — ERGOCALCIFEROL 1.25 MG (50000 UT) PO CAPS
50000.0000 [IU] | ORAL_CAPSULE | ORAL | 3 refills | Status: AC
  Filled 2024-02-15: qty 12, 84d supply, fill #0

## 2024-06-28 DIAGNOSIS — E119 Type 2 diabetes mellitus without complications: Secondary | ICD-10-CM | POA: Diagnosis not present

## 2024-09-26 DIAGNOSIS — R102 Pelvic and perineal pain unspecified side: Secondary | ICD-10-CM | POA: Diagnosis not present

## 2024-09-26 DIAGNOSIS — Z01419 Encounter for gynecological examination (general) (routine) without abnormal findings: Secondary | ICD-10-CM | POA: Diagnosis not present
